# Patient Record
Sex: Female | Born: 1945 | Race: White | Hispanic: No | Marital: Single | Smoking: Never smoker
Health system: Southern US, Community
[De-identification: ages and names within clinical notes are randomized; demographics above are authoritative.]

## PROBLEM LIST (undated history)

## (undated) DIAGNOSIS — I10 Essential (primary) hypertension: Secondary | ICD-10-CM

## (undated) DIAGNOSIS — S329XXA Fracture of unspecified parts of lumbosacral spine and pelvis, initial encounter for closed fracture: Secondary | ICD-10-CM

## (undated) DIAGNOSIS — S72001A Fracture of unspecified part of neck of right femur, initial encounter for closed fracture: Secondary | ICD-10-CM

## (undated) DIAGNOSIS — D649 Anemia, unspecified: Secondary | ICD-10-CM

## (undated) DIAGNOSIS — E119 Type 2 diabetes mellitus without complications: Secondary | ICD-10-CM

## (undated) DIAGNOSIS — J449 Chronic obstructive pulmonary disease, unspecified: Secondary | ICD-10-CM

## (undated) DIAGNOSIS — S72002A Fracture of unspecified part of neck of left femur, initial encounter for closed fracture: Secondary | ICD-10-CM

## (undated) HISTORY — PX: CARPAL TUNNEL RELEASE: SHX101

## (undated) HISTORY — PX: FOOT SURGERY: SHX648

## (undated) HISTORY — PX: KNEE SURGERY: SHX244

## (undated) HISTORY — PX: OTHER SURGICAL HISTORY: SHX169

## (undated) HISTORY — PX: RIB FRACTURE SURGERY: SHX2358

---

## 2013-09-29 ENCOUNTER — Encounter (HOSPITAL_COMMUNITY): Payer: Self-pay | Admitting: Emergency Medicine

## 2013-09-29 ENCOUNTER — Inpatient Hospital Stay (HOSPITAL_COMMUNITY)
Admission: EM | Admit: 2013-09-29 | Discharge: 2013-10-02 | DRG: 602 | Disposition: A | Discharge status: Skilled Nursing Facility | Payer: Medicare Other | Attending: Internal Medicine | Admitting: Internal Medicine

## 2013-09-29 ENCOUNTER — Inpatient Hospital Stay (HOSPITAL_COMMUNITY): Payer: Medicare Other

## 2013-09-29 ENCOUNTER — Emergency Department (INDEPENDENT_AMBULATORY_CARE_PROVIDER_SITE_OTHER)
Admission: EM | Admit: 2013-09-29 | Discharge: 2013-09-29 | Disposition: A | Discharge status: Other Acute Inpatient Hospital | Payer: Medicare Other | Source: Home / Self Care | Attending: Emergency Medicine | Admitting: Emergency Medicine

## 2013-09-29 DIAGNOSIS — Z86718 Personal history of other venous thrombosis and embolism: Secondary | ICD-10-CM | POA: Diagnosis not present

## 2013-09-29 DIAGNOSIS — Z880 Allergy status to penicillin: Secondary | ICD-10-CM | POA: Diagnosis not present

## 2013-09-29 DIAGNOSIS — E1149 Type 2 diabetes mellitus with other diabetic neurological complication: Secondary | ICD-10-CM | POA: Diagnosis present

## 2013-09-29 DIAGNOSIS — E1142 Type 2 diabetes mellitus with diabetic polyneuropathy: Secondary | ICD-10-CM | POA: Diagnosis present

## 2013-09-29 DIAGNOSIS — J4489 Other specified chronic obstructive pulmonary disease: Secondary | ICD-10-CM | POA: Diagnosis present

## 2013-09-29 DIAGNOSIS — L02619 Cutaneous abscess of unspecified foot: Secondary | ICD-10-CM | POA: Diagnosis present

## 2013-09-29 DIAGNOSIS — R600 Localized edema: Secondary | ICD-10-CM | POA: Diagnosis present

## 2013-09-29 DIAGNOSIS — I509 Heart failure, unspecified: Secondary | ICD-10-CM | POA: Diagnosis present

## 2013-09-29 DIAGNOSIS — IMO0002 Reserved for concepts with insufficient information to code with codable children: Secondary | ICD-10-CM | POA: Diagnosis present

## 2013-09-29 DIAGNOSIS — E1165 Type 2 diabetes mellitus with hyperglycemia: Secondary | ICD-10-CM | POA: Diagnosis not present

## 2013-09-29 DIAGNOSIS — L8993 Pressure ulcer of unspecified site, stage 3: Secondary | ICD-10-CM | POA: Diagnosis not present

## 2013-09-29 DIAGNOSIS — E1169 Type 2 diabetes mellitus with other specified complication: Secondary | ICD-10-CM

## 2013-09-29 DIAGNOSIS — J42 Unspecified chronic bronchitis: Secondary | ICD-10-CM

## 2013-09-29 DIAGNOSIS — Z794 Long term (current) use of insulin: Secondary | ICD-10-CM

## 2013-09-29 DIAGNOSIS — E119 Type 2 diabetes mellitus without complications: Secondary | ICD-10-CM | POA: Diagnosis present

## 2013-09-29 DIAGNOSIS — L89609 Pressure ulcer of unspecified heel, unspecified stage: Secondary | ICD-10-CM | POA: Diagnosis present

## 2013-09-29 DIAGNOSIS — L97409 Non-pressure chronic ulcer of unspecified heel and midfoot with unspecified severity: Secondary | ICD-10-CM | POA: Diagnosis not present

## 2013-09-29 DIAGNOSIS — Z6829 Body mass index (BMI) 29.0-29.9, adult: Secondary | ICD-10-CM | POA: Diagnosis not present

## 2013-09-29 DIAGNOSIS — E1101 Type 2 diabetes mellitus with hyperosmolarity with coma: Secondary | ICD-10-CM

## 2013-09-29 DIAGNOSIS — I82409 Acute embolism and thrombosis of unspecified deep veins of unspecified lower extremity: Secondary | ICD-10-CM | POA: Diagnosis present

## 2013-09-29 DIAGNOSIS — J449 Chronic obstructive pulmonary disease, unspecified: Secondary | ICD-10-CM | POA: Diagnosis not present

## 2013-09-29 DIAGNOSIS — Z8781 Personal history of (healed) traumatic fracture: Secondary | ICD-10-CM

## 2013-09-29 DIAGNOSIS — L03119 Cellulitis of unspecified part of limb: Principal | ICD-10-CM

## 2013-09-29 DIAGNOSIS — E440 Moderate protein-calorie malnutrition: Secondary | ICD-10-CM | POA: Diagnosis not present

## 2013-09-29 DIAGNOSIS — L8990 Pressure ulcer of unspecified site, unspecified stage: Secondary | ICD-10-CM

## 2013-09-29 DIAGNOSIS — L089 Local infection of the skin and subcutaneous tissue, unspecified: Secondary | ICD-10-CM | POA: Diagnosis present

## 2013-09-29 DIAGNOSIS — E1100 Type 2 diabetes mellitus with hyperosmolarity without nonketotic hyperglycemic-hyperosmolar coma (NKHHC): Secondary | ICD-10-CM

## 2013-09-29 DIAGNOSIS — L97429 Non-pressure chronic ulcer of left heel and midfoot with unspecified severity: Secondary | ICD-10-CM

## 2013-09-29 DIAGNOSIS — I82403 Acute embolism and thrombosis of unspecified deep veins of lower extremity, bilateral: Secondary | ICD-10-CM

## 2013-09-29 DIAGNOSIS — Z881 Allergy status to other antibiotic agents status: Secondary | ICD-10-CM

## 2013-09-29 DIAGNOSIS — I10 Essential (primary) hypertension: Secondary | ICD-10-CM | POA: Diagnosis not present

## 2013-09-29 DIAGNOSIS — R609 Edema, unspecified: Secondary | ICD-10-CM

## 2013-09-29 DIAGNOSIS — L97509 Non-pressure chronic ulcer of other part of unspecified foot with unspecified severity: Secondary | ICD-10-CM | POA: Diagnosis present

## 2013-09-29 DIAGNOSIS — I5032 Chronic diastolic (congestive) heart failure: Secondary | ICD-10-CM | POA: Diagnosis present

## 2013-09-29 HISTORY — DX: Fracture of unspecified part of neck of left femur, initial encounter for closed fracture: S72.002A

## 2013-09-29 HISTORY — DX: Chronic obstructive pulmonary disease, unspecified: J44.9

## 2013-09-29 HISTORY — DX: Type 2 diabetes mellitus without complications: E11.9

## 2013-09-29 HISTORY — DX: Fracture of unspecified part of neck of right femur, initial encounter for closed fracture: S72.001A

## 2013-09-29 HISTORY — DX: Fracture of unspecified parts of lumbosacral spine and pelvis, initial encounter for closed fracture: S32.9XXA

## 2013-09-29 HISTORY — DX: Anemia, unspecified: D64.9

## 2013-09-29 HISTORY — DX: Essential (primary) hypertension: I10

## 2013-09-29 LAB — CBC
HCT: 34.9 % — ABNORMAL LOW (ref 36.0–46.0)
HEMOGLOBIN: 12.5 g/dL (ref 12.0–15.0)
MCH: 28.1 pg (ref 26.0–34.0)
MCHC: 35.8 g/dL (ref 30.0–36.0)
MCV: 78.4 fL (ref 78.0–100.0)
Platelets: 156 10*3/uL (ref 150–400)
RBC: 4.45 MIL/uL (ref 3.87–5.11)
RDW: 13 % (ref 11.5–15.5)
WBC: 5.8 10*3/uL (ref 4.0–10.5)

## 2013-09-29 LAB — BASIC METABOLIC PANEL
Anion gap: 17 — ABNORMAL HIGH (ref 5–15)
BUN: 22 mg/dL (ref 6–23)
CALCIUM: 9.5 mg/dL (ref 8.4–10.5)
CO2: 20 mEq/L (ref 19–32)
Chloride: 99 mEq/L (ref 96–112)
Creatinine, Ser: 1.02 mg/dL (ref 0.50–1.10)
GFR calc Af Amer: 64 mL/min — ABNORMAL LOW (ref >90)
GFR calc non Af Amer: 56 mL/min — ABNORMAL LOW (ref >90)
GLUCOSE: 365 mg/dL — AB (ref 70–99)
Potassium: 4.4 mEq/L (ref 3.7–5.3)
Sodium: 136 mEq/L — ABNORMAL LOW (ref 137–147)

## 2013-09-29 LAB — GLUCOSE, CAPILLARY: Glucose-Capillary: 310 mg/dL — ABNORMAL HIGH (ref 70–99)

## 2013-09-29 LAB — CBG MONITORING, ED: Glucose-Capillary: 353 mg/dL — ABNORMAL HIGH (ref 70–99)

## 2013-09-29 MED ORDER — DIPHENHYDRAMINE HCL 50 MG/ML IJ SOLN
25.0000 mg | Freq: Once | INTRAMUSCULAR | Status: AC
  Administered 2013-09-29: 25 mg via INTRAVENOUS
  Filled 2013-09-29: qty 1

## 2013-09-29 MED ORDER — SODIUM CHLORIDE 0.9 % IV SOLN
Freq: Once | INTRAVENOUS | Status: AC
  Administered 2013-09-29: 21:00:00 via INTRAVENOUS

## 2013-09-29 MED ORDER — OXYCODONE-ACETAMINOPHEN 5-325 MG PO TABS
1.0000 | ORAL_TABLET | Freq: Once | ORAL | Status: AC
Start: 2013-09-29 — End: 2013-09-29
  Administered 2013-09-29: 1 via ORAL
  Filled 2013-09-29: qty 1

## 2013-09-29 MED ORDER — VANCOMYCIN HCL IN DEXTROSE 1-5 GM/200ML-% IV SOLN
1000.0000 mg | Freq: Once | INTRAVENOUS | Status: AC
  Administered 2013-09-29: 1000 mg via INTRAVENOUS
  Filled 2013-09-29: qty 200

## 2013-09-29 NOTE — H&P (Signed)
[facility] History and Physical  Sylvan Lahm ZOX:[id number] DOB: [birth year] DOA: 09/29/2013  Referring physician: ED physician PCP: No PCP Per Patient  Specialists:   Chief Complaint: Foot ulcers.  HPI: Katie Mullins is a 68 y.o. 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  Patient reports that she has several ulcers over her feet and leg. The biggest ulcer is located in his left heel. It has been going on since March 2015. She took oral antibiotics when she was in nursing home (does not remember the name of antibiotics) without significant help. It has been progressively getting worse. It becomes more tender recently.   She also has a small ulcers over left great toe, which is also tender, but no drainage. The third ulcers is located over her right medial lower leg. It is tender, but no drainage coming out. She does not have fever or chills, cough, chest pain, nausea, or vomiting or abdominal pain. Patient also reports bilateral lower leg edema without clear diagnosis. She is currently taking Lasix for leg edema. She does not have leukocytosis on admission. She is slightly tachycardia and creatinine normal on admission. She is admitted to MedSurg bed for further evaluation and treatment.   Of note, her social situation is very concerning. She was just discharged from a nursing home in [place] to [facility] [place] on 9/112/15. No clear plan regarding where to live next. She has 4 sons, one of them is living in [place].   Review of Systems: As presented in the history of presenting illness, rest negative.  Where does patient live?  [facility]. Can patient participate in ADLs? Partially.   Allergy:  Allergies  Allergen Reactions  . Cephalexin   . Iodine   . Levaquin [Levofloxacin In D5w]    . Penicillins     anaphylaxis    Past Medical History  Diagnosis Date  . Hypertension   . Diabetes mellitus without complication   . COPD (chronic obstructive pulmonary disease)   . Anemia   . Hip fracture, right   . Hip fracture, left   . Pelvic fracture     Past Surgical History  Procedure Laterality Date  . Knee surgery      bilateral  . Carpal tunnel release      Social History:  reports that she has never smoked. She does not have any smokeless tobacco history on file. She reports that she does not drink alcohol. Her drug history is not on file.  Family History:  Family History  Problem Relation Age of Onset  . Rheum arthritis Mother   . Cancer Mother     died of stomach cancer  . Cancer Father     died of prostate cancer  . Cancer Brother     Prostate cancer  . Diabetes Brother      Prior to Admission medications   Medication Sig Start Date End Date Taking? Authorizing Provider  cyclobenzaprine (FLEXERIL) 5 MG tablet Take 5 mg by mouth 3 (three) times daily as needed for muscle spasms.   Yes Historical Provider, MD  furosemide (LASIX) 40 MG tablet Take 40 mg by mouth daily.    Yes Historical Provider, MD  insulin glargine (LANTUS) 100 UNIT/ML injection Inject 15 Units into the skin at bedtime.    Yes Historical Provider, MD  insulin lispro (  HUMALOG) 100 UNIT/ML injection Inject 5 Units into the skin 3 (three) times daily before meals.    Yes Historical Provider, MD  lisinopril (PRINIVIL,ZESTRIL) 10 MG tablet Take 10 mg by mouth daily.   Yes Historical Provider, MD  omeprazole (PRILOSEC) 20 MG capsule Take 20 mg by mouth daily.   Yes Historical Provider, MD  oxyCODONE-acetaminophen (PERCOCET/ROXICET) 5-325 MG per tablet Take 1 tablet by mouth every 4 (four) hours as needed for moderate pain or severe pain.   Yes Historical Provider, MD  potassium chloride (K-DUR,KLOR-CON) 10 MEQ tablet Take 10 mEq by mouth 2 (two) times daily.   Yes Historical Provider, MD   Rivaroxaban (XARELTO) 15 MG TABS tablet Take 15 mg by mouth daily.   Yes Historical Provider, MD    Physical Exam: Filed Vitals:   09/29/13 2200 09/29/13 2230 09/29/13 2315 09/30/13 0000  BP: 150/73 161/81 144/68 145/87  Pulse: 98 99 99 94  Temp:      TempSrc:      Resp: SpO2: 97% 99% 98% 96%   General: Not in acute distress HEENT:       Eyes: PERRL, EOMI, no scleral icterus       ENT: No discharge from the ears and nose, no pharynx injection, no tonsillar enlargement.        Neck: No JVD, no bruit, no mass felt. Cardiac: S1/S2, RRR, No murmurs, gallops or rubs Pulm: Good air movement bilaterally. Clear to auscultation bilaterally. No rales, wheezing, rhonchi or rubs. Abd: Soft, nondistended, nontender, no rebound pain, no organomegaly, BS present Ext: trace amount of leg edema bilaterally. There is an ulcer on her left heel which has yellow colored draining. It is very tender and erythematous, proximately 1.5x3 cm in size. She has a small ulcerated area over right medial lower leg, proximately 0.3 x 2 cm in size, with some surrounding erythema. It is tender to palpation, but no purulent drainage. She also has a very tiny purplish area on the tip of her left great toe. It is tender, but no drainage. Her feet are pale and cool. Capillary refill is poor, but DP/PT pulse are palpable.  Musculoskeletal: No joint deformities, erythema, or stiffness, ROM full Skin: No rashes.  Neuro: Alert and oriented X3, cranial nerves II-XII grossly intact, muscle strength 5/5 in all extremeties, sensation to light touch intact. Brachial reflex 2+ bilaterally. Knee reflex 1+ bilaterally.  Psych: Patient is not psychotic, no suicidal or hemocidal ideation.  Labs on Admission:  Basic Metabolic Panel:  Recent Labs Lab 09/29/13 2029  NA 136*  K 4.4  CL 99  CO2 20  GLUCOSE 365*  BUN 22  CREATININE 1.02  CALCIUM 9.5   Liver Function Tests: No results found for this basename: AST, ALT,  ALKPHOS, BILITOT, PROT, ALBUMIN,  in the last 168 hours No results found for this basename: LIPASE, AMYLASE,  in the last 168 hours No results found for this basename: AMMONIA,  in the last 168 hours CBC:  Recent Labs Lab 09/29/13 2029  WBC 5.8  HGB 12.5  HCT 34.9*  MCV 78.4  PLT 156   Cardiac Enzymes: No results found for this basename: CKTOTAL, CKMB, CKMBINDEX, TROPONINI,  in the last 168 hours  BNP (last 3 results) No results found for this basename: PROBNP,  in the last 8760 hours CBG:  Recent Labs Lab 09/29/13 1524 09/29/13 1921  GLUCAP 310* 353*    Radiological Exams on Admission: Dg Foot Complete Left  09/29/2013   CLINICAL DATA:  Sore on posterior heel with no injury, infected ulcer, diabetes  EXAM: LEFT FOOT - COMPLETE 3+ VIEW  COMPARISON:  None.  FINDINGS: Diffuse osteopenia. No evidence of periosteal reaction. No evidence of cortical destruction. Extensive vascular calcification. No fracture or dislocation.  IMPRESSION: No acute findings.  No radiographic evidence of osteomyelitis.   Electronically Signed   By: Esperanza Heir M.D.   On: 09/29/2013 23:24    EKG: Independently reviewed.   Assessment/Plan Principal Problem:   Left foot infection Active Problems:   DVT (deep venous thrombosis)   Hypertension   Diabetes mellitus without complication   COPD (chronic obstructive pulmonary disease)   Bilateral leg edema  1. foot infection: The ulcer over her left heel is concerning for infection given redness, tenderness and yellow-colored discharge. She seems to have poor circulation in legs. Given hx of diabetes, it is appropriate to treat empirically with antibiotics to cover mixed flora. At the same, will get X-ray to evaluate any possibility of osteomyelitis. Currently patient is hemodynamically stable, not in septic picture.  - will admit to MedSurg bed - IV vancomycin plus oral Flagyl - Blood culture x2 - X-ray left foot - Hold Lasix given that patient is  at risk for sepsis - Discontinue IV fluid, given that patient has a bilateral leg edema without clear diagnosis. It is possible that patient has a chronic congestive heart failure. - Consult wound care  2. recurrent lower leg DVT: Currently on Xarelto. No signs of bleeding. - continue Xarelto  3. bilateral leg edema: this seems to be a chronic issue without clear diagnosis. Patient is currently taking Lasix for leg edema. She has trace amount of leg edema on admission. It is possible that patient could have undiagnosed congestive heart failure given her loang hx of HTN - hold lasix for now as above. - check 2d echo  4. DM-II: Patient is taking Lantus and sliding scale insulin at home. She reports taking Lantus 15 units twice a day, but our record showing Lantus 15 units daily. No A1c was recorded.  - will start with lantus 15 U daily for now and make an adjustment tomorrow based on her CBG level - SSI - check A1c  DVT ppx: on Xarelto  Code Status: Full code Family Communication: None at bed side. Disposition Plan: Admit to inpatient  Lorretta Harp [facility] Pager [phone]  If 7PM-7AM, please contact night-coverage [email or web address] Password [facility] 09/30/2013, 12:33 AM

## 2013-09-29 NOTE — Discharge Instructions (Signed)
We have determined that your problem requires further evaluation in the emergency department.  We will take care of your transport there.  Once at the emergency department, you will be evaluated by a provider and they will order whatever treatment or tests they deem necessary.  We cannot guarantee that they will do any specific test or do any specific treatment.  ° °

## 2013-09-29 NOTE — ED Notes (Signed)
Pt attempting to get out of bed to get pain medication out of her bag.  Pt states she needs Flexeril and Percocet because her "body is locking up."  Informed pt that MD and made her NPO but that I would check with him to see about pain medication.  Encouraged pt not to take her own medication without the MDs permission and to call for assistance when she needs to get up.  Bed rails up and call bell within reach.

## 2013-09-29 NOTE — ED Notes (Signed)
Pt still unable to give urine sample

## 2013-09-29 NOTE — ED Notes (Addendum)
Pt states that she has ulcers to left foot and toe and rt lower leg. Pt states that the left side has been present since march and are not healing. Drainage noted to left heal. Foot wrapped.

## 2013-09-29 NOTE — ED Provider Notes (Signed)
CSN: [id number]     Arrival date & time 09/29/13  1611 History   First MD Initiated Contact with Patient 09/29/13 1939     Chief Complaint  Patient presents with  . Leg Pain  . Foot Pain     (Consider location/radiation/quality/duration/timing/severity/associated sxs/prior Treatment) Patient is a 68 y.o. female presenting with leg pain and lower extremity pain. The history is provided by the patient.  Leg Pain Location:  Foot Injury: no   Foot location:  Sole of L foot Pain details:    Quality:  Aching   Severity:  Moderate   Onset quality:  Gradual   Duration:  10 days   Timing:  Constant   Progression:  Unchanged Chronicity:  New Relieved by:  Nothing Worsened by:  Nothing tried Associated symptoms: no decreased ROM, no fatigue, no fever, no neck pain and no numbness   Foot Pain    Past Medical History  Diagnosis Date  . Hypertension   . Diabetes mellitus without complication   . COPD (chronic obstructive pulmonary disease)   . Anemia   . Hip fracture, right   . Hip fracture, left   . Pelvic fracture    Past Surgical History  Procedure Laterality Date  . Knee surgery      bilateral  . Carpal tunnel release     Family History  Problem Relation Age of Onset  . Rheum arthritis Mother   . Cancer Mother     died of stomach cancer  . Cancer Father     died of prostate cancer  . Cancer Brother     Prostate cancer  . Diabetes Brother    History  Substance Use Topics  . Smoking status: Never Smoker   . Smokeless tobacco: Not on file  . Alcohol Use: No   OB History   Grav Para Term Preterm Abortions TAB SAB Ect Mult Living                 Review of Systems  Constitutional: Negative for fever and fatigue.  Musculoskeletal: Negative for neck pain.  All other systems reviewed and are negative.     Allergies  Cephalexin; Iodine; Levaquin; and Penicillins  Home Medications   Prior to Admission medications   Medication Sig Start Date End Date  Taking? Authorizing Provider  cyclobenzaprine (FLEXERIL) 5 MG tablet Take 5 mg by mouth 3 (three) times daily as needed for muscle spasms.   Yes Historical Provider, MD  furosemide (LASIX) 40 MG tablet Take 40 mg by mouth daily.    Yes Historical Provider, MD  insulin glargine (LANTUS) 100 UNIT/ML injection Inject 15 Units into the skin at bedtime.    Yes Historical Provider, MD  insulin lispro (HUMALOG) 100 UNIT/ML injection Inject 5 Units into the skin 3 (three) times daily before meals.    Yes Historical Provider, MD  lisinopril (PRINIVIL,ZESTRIL) 10 MG tablet Take 10 mg by mouth daily.   Yes Historical Provider, MD  omeprazole (PRILOSEC) 20 MG capsule Take 20 mg by mouth daily.   Yes Historical Provider, MD  oxyCODONE-acetaminophen (PERCOCET/ROXICET) 5-325 MG per tablet Take 1 tablet by mouth every 4 (four) hours as needed for moderate pain or severe pain.   Yes Historical Provider, MD  potassium chloride (K-DUR,KLOR-CON) 10 MEQ tablet Take 10 mEq by mouth 2 (two) times daily.   Yes Historical Provider, MD  Rivaroxaban (XARELTO) 15 MG TABS tablet Take 15 mg by mouth daily.   Yes Historical Provider, MD  BP 144/68  Pulse 99  Temp(Src) 97.6 F (36.4 C) (Oral)  Resp 17  SpO2 98% Physical Exam  Nursing note and vitals reviewed. Constitutional: She is oriented to person, place, and time. She appears well-developed and well-nourished. No distress.  HENT:  Head: Normocephalic and atraumatic.  Mouth/Throat: Oropharynx is clear and moist.  Eyes: EOM are normal. Pupils are equal, round, and reactive to light.  Neck: Normal range of motion. Neck supple.  Cardiovascular: Normal rate and regular rhythm.  Exam reveals no friction rub.   No murmur heard. Pulmonary/Chest: Effort normal and breath sounds normal. No respiratory distress. She has no wheezes. She has no rales.  Abdominal: Soft. She exhibits no distension. There is no tenderness. There is no rebound.  Musculoskeletal: Normal range of  motion. She exhibits no edema.       Feet:  Neurological: She is alert and oriented to person, place, and time. No cranial nerve deficit. She exhibits normal muscle tone. Coordination normal.  Skin: No rash noted. She is not diaphoretic.    ED Course  Procedures (including critical care time) Labs Review Labs Reviewed  CBC - Abnormal; Notable for the following:    HCT 34.9 (*)    All other components within normal limits  BASIC METABOLIC PANEL - Abnormal; Notable for the following:    Sodium 136 (*)    Glucose, Bld 365 (*)    GFR calc non Af Amer 56 (*)    GFR calc Af Amer 64 (*)    Anion gap 17 (*)    All other components within normal limits  CBG MONITORING, ED - Abnormal; Notable for the following:    Glucose-Capillary 353 (*)    All other components within normal limits  URINALYSIS, ROUTINE W REFLEX MICROSCOPIC    Imaging Review Dg Foot Complete Left  09/29/2013   CLINICAL DATA:  Sore on posterior heel with no injury, infected ulcer, diabetes  EXAM: LEFT FOOT - COMPLETE 3+ VIEW  COMPARISON:  None.  FINDINGS: Diffuse osteopenia. No evidence of periosteal reaction. No evidence of cortical destruction. Extensive vascular calcification. No fracture or dislocation.  IMPRESSION: No acute findings.  No radiographic evidence of osteomyelitis.   Electronically Signed   By: Esperanza Heir M.D.   On: 09/29/2013 23:24     EKG Interpretation None      MDM   Final diagnoses:  Diabetes mellitus without complication    72F sent from Urgent Care for concerns of being unable to care for herself. Recently discharged from a nursing home s/p hip/knee surgery. Has been staying at an extended stay hotel and doesn't have anyone to help take care of her. She has developed ulcers on her feet from being bedbound.  Here has L heel wound with purulent base, no surrounding cellulitis or red streaking. Patient reports diarrhea, chills, nausea - concern for possible systemic symptoms from her heel  infection.  Admitted.    Elwin Mocha, MD 09/29/13 [phone]

## 2013-09-29 NOTE — ED Provider Notes (Signed)
Chief Complaint   Foot Pain   History of Present Illness   Katie Mullins is an unfortunate 68 year old female who was in a nursing home for the past year due to bilateral hip and pelvic fractures. She was released from the nursing home 12 days ago and moved here to [place], even though she has no family in [place] to look after her or take care of her. She is living on her own in an Extended Stay [address] and has no primary caretaker in the area. She uses a wheelchair and walker to get around, although I get the impression that she's not very mobile or very ambulatory. Her main reason for presenting here today was a painful ulcer on her right pretibial surface. This is been going on for the past 6 years, but opened up again 10 days ago and has been sleeping a clear fluid. She has a chronic ulcer on her left heel going on since March. This is about the same and is been draining a little bit of clear liquid. She also has a small ulcerated spot on the tip of her left big toe. She has pain in both of her legs and aching. She is short of breath with minimal exertion she denies any fever or chills. She's had no chest pain, abdominal pain, nausea, or vomiting. She's got diabetes and notes her blood sugars are out of control, often in the 400s. She is on Lantus and Humalog.  Review of Systems     Other than as noted above, the patient denies any of the following symptoms: Systemic:  No fever, chills, fatigue, myalgias, headache, or anorexia. Eye:  No redness, pain or drainage. ENT:  No earache, nasal congestion, rhinorrhea, sinus pressure, or sore throat. Lungs:  No cough, sputum production, wheezing, shortness of breath.  Cardiovascular:  No chest pain, palpitations, or syncope. GI:  No nausea, vomiting, abdominal pain or diarrhea. GU:  No dysuria, frequency, or hematuria. Skin:  No rash or pruritis.   PMFSH     Past medical history, family history, social history, meds, and allergies were  reviewed.  She's allergic to penicillin and IV contrast dye, Levaquin, and cephalexin. She has high blood pressure, and is on a long list of medications.  Physical Examination    Vital signs:  BP 139/82  Pulse 102  Temp(Src) 98.7 F (37.1 C) (Oral)  Resp 16  SpO2 98% General:  Alert, in no distress. Eye:  PERRL, full EOMs.  Lids and conjunctivas were normal. ENT:  TMs and canals were normal, without erythema or inflammation.  Nasal mucosa was clear and uncongested, without drainage.  Mucous membranes were moist.  Pharynx was clear, without exudate or drainage.  There were no oral ulcerations or lesions. Neck:  Supple, no adenopathy, tenderness or mass. Thyroid was normal. Lungs:  No respiratory distress.  Lungs were clear to auscultation, without wheezes, rales or rhonchi.  Breath sounds were clear and equal bilaterally. Heart:  Regular rhythm, without gallops, murmers or rubs. Abdomen:  Soft, flat, and non-tender to palpation.  No hepatosplenomagaly or mass. Extremities: She has a 2 cm ulcerated area on her left pretibial surface with some surrounding erythema and this was tender to palpation. There is no purulent drainage. There is a large decubitus ulcer on her left heel which was not draining any purulent drainage. She also has a small purplish area on the tip of her left great toe. She states this just appeared recently. Her feet are pale and  cool. Capillary refill is poor. She has a good, bounding pulse on the left side and on the right. Skin:  Clear, warm, and dry, without rash or lesions.        Mood and   Labs   Results for orders placed during the hospital encounter of 09/29/13  GLUCOSE, CAPILLARY      Result Value Ref Range   Glucose-Capillary 310 (*) 70 - 99 mg/dL   Comment 1 Notify RN     Comment 2 Documented in Chart     Assessment   The primary encounter diagnosis was Decubitus ulcers. A diagnosis of Type 2 diabetes mellitus with hyperosmolarity without coma was  also pertinent to this visit.  My concern is that this lady has some significant looking ulcerations on her legs, is essentially living by herself without any caregiver, and on her own as for his medical care. I feel she needs to be back to nursing home again. She'll need a three-day hospital stay prior to being able to be placed in a nursing home. Also her diabetes is not well controlled.  Plan     The patient was transferred to the ED via [facility] in stable condition.  Medical Decision Making:  68 year old female who needs to be admitted for nursing home placement.  She was just discharged from a nursing home in [place], [place] 12 days ago after being a patient there for 1  Year.  She has been staying at [facility] here for past 12 days with no one to take care of her.  She has developed several decubitus  Ulcers on her legs.  Her blood sugar is in the 400s.  She needs to return a nursing home, but will need a 3 day hospitalization first.          Reuben Likes, MD 09/29/13 [phone]

## 2013-09-29 NOTE — ED Notes (Signed)
Pt c/o head itching.  Vancomycin just completed. Dr. Gwendolyn Grant notified of pt itching and pain.

## 2013-09-29 NOTE — ED Notes (Signed)
Multiple problems. Relocating to area

## 2013-09-29 NOTE — ED Notes (Addendum)
Patient transported to X-ray 

## 2013-09-29 NOTE — ED Notes (Signed)
Admitting MD at bedside.

## 2013-09-29 NOTE — ED Notes (Signed)
Pt monitored by pulse ox, bp cuff, and 5-lead. 

## 2013-09-30 ENCOUNTER — Encounter (HOSPITAL_COMMUNITY): Payer: Self-pay | Admitting: *Deleted

## 2013-09-30 ENCOUNTER — Inpatient Hospital Stay (HOSPITAL_COMMUNITY): Payer: Medicare Other

## 2013-09-30 DIAGNOSIS — I82409 Acute embolism and thrombosis of unspecified deep veins of unspecified lower extremity: Secondary | ICD-10-CM

## 2013-09-30 DIAGNOSIS — E440 Moderate protein-calorie malnutrition: Secondary | ICD-10-CM | POA: Insufficient documentation

## 2013-09-30 DIAGNOSIS — I059 Rheumatic mitral valve disease, unspecified: Secondary | ICD-10-CM

## 2013-09-30 DIAGNOSIS — I10 Essential (primary) hypertension: Secondary | ICD-10-CM

## 2013-09-30 DIAGNOSIS — J42 Unspecified chronic bronchitis: Secondary | ICD-10-CM

## 2013-09-30 DIAGNOSIS — L089 Local infection of the skin and subcutaneous tissue, unspecified: Secondary | ICD-10-CM

## 2013-09-30 DIAGNOSIS — E119 Type 2 diabetes mellitus without complications: Secondary | ICD-10-CM

## 2013-09-30 DIAGNOSIS — L97409 Non-pressure chronic ulcer of unspecified heel and midfoot with unspecified severity: Secondary | ICD-10-CM

## 2013-09-30 LAB — URINALYSIS, ROUTINE W REFLEX MICROSCOPIC
Bilirubin Urine: NEGATIVE
KETONES UR: NEGATIVE mg/dL
Nitrite: NEGATIVE
PH: 5 (ref 5.0–8.0)
Protein, ur: 30 mg/dL — AB
Specific Gravity, Urine: 1.015 (ref 1.005–1.030)
Urobilinogen, UA: 0.2 mg/dL (ref 0.0–1.0)

## 2013-09-30 LAB — COMPREHENSIVE METABOLIC PANEL
ALBUMIN: 3.3 g/dL — AB (ref 3.5–5.2)
ALK PHOS: 128 U/L — AB (ref 39–117)
ALT: 16 U/L (ref 0–35)
ANION GAP: 13 (ref 5–15)
AST: 16 U/L (ref 0–37)
BUN: 23 mg/dL (ref 6–23)
CO2: 24 mEq/L (ref 19–32)
Calcium: 8.7 mg/dL (ref 8.4–10.5)
Chloride: 99 mEq/L (ref 96–112)
Creatinine, Ser: 1.19 mg/dL — ABNORMAL HIGH (ref 0.50–1.10)
GFR calc Af Amer: 54 mL/min — ABNORMAL LOW (ref >90)
GFR calc non Af Amer: 46 mL/min — ABNORMAL LOW (ref >90)
Glucose, Bld: 411 mg/dL — ABNORMAL HIGH (ref 70–99)
Potassium: 3.7 mEq/L (ref 3.7–5.3)
Sodium: 136 mEq/L — ABNORMAL LOW (ref 137–147)
TOTAL PROTEIN: 5.9 g/dL — AB (ref 6.0–8.3)
Total Bilirubin: 0.5 mg/dL (ref 0.3–1.2)

## 2013-09-30 LAB — CBC WITH DIFFERENTIAL/PLATELET
Basophils Absolute: 0 10*3/uL (ref 0.0–0.1)
Basophils Relative: 0 % (ref 0–1)
Eosinophils Absolute: 0.1 10*3/uL (ref 0.0–0.7)
Eosinophils Relative: 2 % (ref 0–5)
HCT: 30 % — ABNORMAL LOW (ref 36.0–46.0)
HEMOGLOBIN: 10.4 g/dL — AB (ref 12.0–15.0)
LYMPHS PCT: 31 % (ref 12–46)
Lymphs Abs: 1.6 10*3/uL (ref 0.7–4.0)
MCH: 28 pg (ref 26.0–34.0)
MCHC: 34.7 g/dL (ref 30.0–36.0)
MCV: 80.6 fL (ref 78.0–100.0)
MONO ABS: 0.4 10*3/uL (ref 0.1–1.0)
Monocytes Relative: 9 % (ref 3–12)
NEUTROS ABS: 3 10*3/uL (ref 1.7–7.7)
Neutrophils Relative %: 59 % (ref 43–77)
Platelets: 126 10*3/uL — ABNORMAL LOW (ref 150–400)
RBC: 3.72 MIL/uL — ABNORMAL LOW (ref 3.87–5.11)
RDW: 13 % (ref 11.5–15.5)
WBC: 5 10*3/uL (ref 4.0–10.5)

## 2013-09-30 LAB — URINE MICROSCOPIC-ADD ON

## 2013-09-30 LAB — PROTIME-INR
INR: 1.82 — ABNORMAL HIGH (ref 0.00–1.49)
Prothrombin Time: 21.1 seconds — ABNORMAL HIGH (ref 11.6–15.2)

## 2013-09-30 LAB — HEMOGLOBIN A1C
HEMOGLOBIN A1C: 9.3 % — AB (ref <5.7)
Mean Plasma Glucose: 220 mg/dL — ABNORMAL HIGH (ref <117)

## 2013-09-30 LAB — GLUCOSE, CAPILLARY
GLUCOSE-CAPILLARY: 319 mg/dL — AB (ref 70–99)
Glucose-Capillary: 219 mg/dL — ABNORMAL HIGH (ref 70–99)
Glucose-Capillary: 232 mg/dL — ABNORMAL HIGH (ref 70–99)
Glucose-Capillary: 334 mg/dL — ABNORMAL HIGH (ref 70–99)

## 2013-09-30 LAB — CBG MONITORING, ED: GLUCOSE-CAPILLARY: 389 mg/dL — AB (ref 70–99)

## 2013-09-30 LAB — MRSA PCR SCREENING: MRSA by PCR: POSITIVE — AB

## 2013-09-30 MED ORDER — MUPIROCIN 2 % EX OINT
1.0000 "application " | TOPICAL_OINTMENT | Freq: Two times a day (BID) | CUTANEOUS | Status: DC
  Administered 2013-09-30 – 2013-10-02 (×5): 1 via NASAL
  Filled 2013-09-30 (×2): qty 22

## 2013-09-30 MED ORDER — RIVAROXABAN 15 MG PO TABS
15.0000 mg | ORAL_TABLET | Freq: Every day | ORAL | Status: DC
  Filled 2013-09-30 (×2): qty 1

## 2013-09-30 MED ORDER — INFLUENZA VAC SPLIT QUAD 0.5 ML IM SUSY
0.5000 mL | PREFILLED_SYRINGE | INTRAMUSCULAR | Status: AC
  Administered 2013-10-01: 0.5 mL via INTRAMUSCULAR
  Filled 2013-09-30: qty 0.5

## 2013-09-30 MED ORDER — CHLORHEXIDINE GLUCONATE CLOTH 2 % EX PADS
6.0000 | MEDICATED_PAD | Freq: Every day | CUTANEOUS | Status: DC
  Administered 2013-09-30 – 2013-10-02 (×3): 6 via TOPICAL

## 2013-09-30 MED ORDER — METRONIDAZOLE 500 MG PO TABS
500.0000 mg | ORAL_TABLET | Freq: Three times a day (TID) | ORAL | Status: DC
  Administered 2013-09-30 – 2013-10-02 (×9): 500 mg via ORAL
  Filled 2013-09-30 (×11): qty 1

## 2013-09-30 MED ORDER — PANTOPRAZOLE SODIUM 40 MG PO TBEC
40.0000 mg | DELAYED_RELEASE_TABLET | Freq: Every day | ORAL | Status: DC
  Administered 2013-09-30 – 2013-10-02 (×3): 40 mg via ORAL
  Filled 2013-09-30 (×2): qty 1

## 2013-09-30 MED ORDER — VANCOMYCIN HCL IN DEXTROSE 1-5 GM/200ML-% IV SOLN
1000.0000 mg | Freq: Two times a day (BID) | INTRAVENOUS | Status: DC
  Administered 2013-09-30 – 2013-10-02 (×5): 1000 mg via INTRAVENOUS
  Filled 2013-09-30 (×6): qty 200

## 2013-09-30 MED ORDER — RIVAROXABAN 20 MG PO TABS
20.0000 mg | ORAL_TABLET | Freq: Every day | ORAL | Status: DC
  Administered 2013-09-30 – 2013-10-01 (×2): 20 mg via ORAL
  Filled 2013-09-30 (×3): qty 1

## 2013-09-30 MED ORDER — INSULIN GLARGINE 100 UNIT/ML ~~LOC~~ SOLN
15.0000 [IU] | Freq: Every day | SUBCUTANEOUS | Status: DC
  Administered 2013-09-30: 15 [IU] via SUBCUTANEOUS
  Filled 2013-09-30 (×2): qty 0.15

## 2013-09-30 MED ORDER — CYCLOBENZAPRINE HCL 10 MG PO TABS
5.0000 mg | ORAL_TABLET | Freq: Three times a day (TID) | ORAL | Status: DC | PRN
  Administered 2013-09-30 – 2013-10-02 (×5): 5 mg via ORAL
  Filled 2013-09-30 (×5): qty 1

## 2013-09-30 MED ORDER — OXYCODONE-ACETAMINOPHEN 5-325 MG PO TABS: 1.0000 | ORAL_TABLET | Freq: Three times a day (TID) | ORAL | Status: DC | PRN

## 2013-09-30 MED ORDER — SODIUM CHLORIDE 0.9 % IV SOLN: 250.0000 mL | INTRAVENOUS | Status: DC | PRN

## 2013-09-30 MED ORDER — SODIUM CHLORIDE 0.9 % IJ SOLN: 3.0000 mL | INTRAMUSCULAR | Status: DC | PRN

## 2013-09-30 MED ORDER — GLUCERNA SHAKE PO LIQD
237.0000 mL | Freq: Three times a day (TID) | ORAL | Status: DC
  Administered 2013-09-30 – 2013-10-02 (×6): 237 mL via ORAL

## 2013-09-30 MED ORDER — HEPARIN SODIUM (PORCINE) 5000 UNIT/ML IJ SOLN
5000.0000 [IU] | Freq: Three times a day (TID) | INTRAMUSCULAR | Status: DC
  Administered 2013-09-30: 5000 [IU] via SUBCUTANEOUS
  Filled 2013-09-30: qty 1

## 2013-09-30 MED ORDER — SODIUM CHLORIDE 0.9 % IJ SOLN
3.0000 mL | Freq: Two times a day (BID) | INTRAMUSCULAR | Status: DC
  Administered 2013-09-30 – 2013-10-02 (×5): 3 mL via INTRAVENOUS

## 2013-09-30 MED ORDER — GADOBENATE DIMEGLUMINE 529 MG/ML IV SOLN
17.0000 mL | Freq: Once | INTRAVENOUS | Status: AC | PRN
  Administered 2013-09-30: 17 mL via INTRAVENOUS

## 2013-09-30 MED ORDER — INSULIN GLARGINE 100 UNIT/ML ~~LOC~~ SOLN
22.0000 [IU] | Freq: Every day | SUBCUTANEOUS | Status: DC
  Administered 2013-10-01: 22 [IU] via SUBCUTANEOUS
  Filled 2013-09-30 (×2): qty 0.22

## 2013-09-30 MED ORDER — DIPHENHYDRAMINE HCL 50 MG/ML IJ SOLN
12.5000 mg | Freq: Once | INTRAMUSCULAR | Status: AC
  Administered 2013-09-30: 12.5 mg via INTRAVENOUS
  Filled 2013-09-30: qty 1

## 2013-09-30 MED ORDER — INSULIN ASPART 100 UNIT/ML ~~LOC~~ SOLN: 6.0000 [IU] | Freq: Three times a day (TID) | SUBCUTANEOUS | Status: DC

## 2013-09-30 MED ORDER — LISINOPRIL 10 MG PO TABS
10.0000 mg | ORAL_TABLET | Freq: Every day | ORAL | Status: DC
  Administered 2013-09-30 – 2013-10-02 (×3): 10 mg via ORAL
  Filled 2013-09-30 (×3): qty 1

## 2013-09-30 MED ORDER — OXYCODONE-ACETAMINOPHEN 5-325 MG PO TABS
1.0000 | ORAL_TABLET | Freq: Four times a day (QID) | ORAL | Status: DC | PRN
  Administered 2013-09-30 – 2013-10-02 (×6): 1 via ORAL
  Filled 2013-09-30 (×6): qty 1

## 2013-09-30 MED ORDER — INSULIN ASPART 100 UNIT/ML ~~LOC~~ SOLN
0.0000 [IU] | Freq: Three times a day (TID) | SUBCUTANEOUS | Status: DC
  Administered 2013-09-30: 11 [IU] via SUBCUTANEOUS
  Administered 2013-09-30: 5 [IU] via SUBCUTANEOUS
  Administered 2013-10-01 (×3): 8 [IU] via SUBCUTANEOUS
  Administered 2013-10-02: 3 [IU] via SUBCUTANEOUS
  Administered 2013-10-02: 5 [IU] via SUBCUTANEOUS

## 2013-09-30 NOTE — ED Notes (Signed)
Pt concerned that pain medication was ordered every 8 hours instead of every 4.  Dr. Clyde Lundborg notified and orders changed to every 6 hours.

## 2013-09-30 NOTE — Progress Notes (Signed)
ANTIBIOTIC CONSULT NOTE - INITIAL  Pharmacy Consult for Vancomycin Indication: Cellulitis   Allergies  Allergen Reactions  . Cephalexin   . Iodine   . Levaquin [Levofloxacin In D5w]   . Penicillins     anaphylaxis    Patient Measurements: Height:  (170.2 cm) Weight: 188 lb 4.4 oz (85.4 kg) IBW/kg (Calculated) : 61.6 Adjusted Body Weight: n/a   Vital Signs: Temp: 99.5 F (37.5 C) (09/24 0219) Temp src: Oral (09/24 0219) BP: 163/86 mmHg (09/24 0219) Pulse Rate: 96 (09/24 0219) Intake/Output from previous day:   Intake/Output from this shift:    Labs:  Recent Labs  09/29/13 2029  WBC 5.8  HGB 12.5  PLT 156  CREATININE 1.02   Estimated Creatinine Clearance: 60.1 ml/min (by C-G formula based on Cr of 1.02). No results found for this basename: VANCOTROUGH, VANCOPEAK, VANCORANDOM, GENTTROUGH, GENTPEAK, GENTRANDOM, TOBRATROUGH, TOBRAPEAK, TOBRARND, AMIKACINPEAK, AMIKACINTROU, AMIKACIN,  in the last 72 hours   Microbiology: No results found for this or any previous visit (from the past 720 hour(s)).  Medical History: Past Medical History  Diagnosis Date  . Hypertension   . Diabetes mellitus without complication   . COPD (chronic obstructive pulmonary disease)   . Anemia   . Hip fracture, right   . Hip fracture, left   . Pelvic fracture     Medications:  Prescriptions prior to admission  Medication Sig Dispense Refill  . cyclobenzaprine (FLEXERIL) 5 MG tablet Take 5 mg by mouth 3 (three) times daily as needed for muscle spasms.      . furosemide (LASIX) 40 MG tablet Take 40 mg by mouth daily.       . insulin glargine (LANTUS) 100 UNIT/ML injection Inject 15 Units into the skin at bedtime.       . insulin lispro (HUMALOG) 100 UNIT/ML injection Inject 5 Units into the skin 3 (three) times daily before meals.       Marland Kitchen lisinopril (PRINIVIL,ZESTRIL) 10 MG tablet Take 10 mg by mouth daily.      Marland Kitchen omeprazole (PRILOSEC) 20 MG capsule Take 20 mg by mouth daily.       Marland Kitchen oxyCODONE-acetaminophen (PERCOCET/ROXICET) 5-325 MG per tablet Take 1 tablet by mouth every 4 (four) hours as needed for moderate pain or severe pain.      . potassium chloride (K-DUR,KLOR-CON) 10 MEQ tablet Take 10 mEq by mouth 2 (two) times daily.      . Rivaroxaban (XARELTO) 15 MG TABS tablet Take 15 mg by mouth daily.       Assessment: 60 YOF who presented to the ED with foot ulcers. Pharmacy consulted to start Vancomycin in patient for foot cellulitis. She has already received Vancomycin 1 gm IV x 1 dose in the ED. WBC is wnl, Tmax 99.5 F. CrCl ~ 60 mL/min. Pt will get x-ray to rule out any possibility of osteomyelitis.   Goal of Therapy:  Vancomycin trough level 10-15 mcg/ml  Plan:  1) Start patient on Vancomycin 1 gm IV Q 12 hours 2) Monitor CBC, renal fx, cultures and patient's clinical progress 3) Increase Vanc dose and trough goal if patient is ruled to have osteomyelitis 4) VT at [facility], PharmD.  Clinical Pharmacist Pager [phone]

## 2013-09-30 NOTE — Care Management Note (Signed)
    Page 1 of 1   10/01/2013     2:35:33 PM CARE MANAGEMENT NOTE 10/01/2013  Patient:  Katie Mullins, Katie Mullins   Account Number:  [id number]  Date Initiated:  09/30/2013  Documentation initiated by:  Letha Cape  Subjective/Objective Assessment:   dx foot ulcer  admit- lives in exteded stay hotel.     Action/Plan:   pt eval- rec snf   Anticipated DC Date:  10/01/2013   Anticipated DC Plan:  SKILLED NURSING FACILITY  In-house referral  Clinical Social Worker      DC Planning Services  CM consult      Choice offered to / List presented to:             Status of service:  Completed, signed off Medicare Important Message given?  YES (If response is "NO", the following Medicare IM given date fields will be blank) Date Medicare IM given:  10/01/2013 Medicare IM given by:  Letha Cape Date Additional Medicare IM given:   Additional Medicare IM given by:    Discharge Disposition:  SKILLED NURSING FACILITY  Per UR Regulation:  Reviewed for med. necessity/level of care/duration of stay  If discussed at Long Length of Stay Meetings, dates discussed:    Comments:  10/01/13 1359 Letha Cape RN, BSN [phone] NCM spoke with CSW ,he states patient has no medicare days, so he will need to speak with patient to see if patient will be willing to pay privately , if not she will need to go home with [facility] services, NCM  will set up [facility] services if needed.  Per CSW we will do LOG for patient to go to snf today.  09/30/13 1438 Letha Cape RN, BSN [phone] patient lives in extended stay hotel, patient will likely need snf at dc. CSW aware.

## 2013-09-30 NOTE — Progress Notes (Signed)
PATIENT DETAILS Name: Katie Mullins Age: 68 y.o. Sex: female Date of Birth: [birth year] Admit Date: 09/29/2013 Admitting Physician Lorretta Harp, MD PCP:No PCP Per Patient  Subjective: No major issues overnight  Assessment/Plan: Principal Problem:   Left foot infection - Cellulitis surrounding left heel ulcer, no discharge evident. - Admitted, and started on IV vancomycin/Flagyl-day 1 - Await wound care consultation, check MRI to make sure no osteomyelitis - Suspect will need SNF on discharge  Active Problems: Uncontrolled diabetes - Increase Lantus to 22 units, start scheduled pre-meal NovoLog 6 units 3 times a day, continue with SSI. Follow CBGs, will adjust accordingly  History of bilateral  DVT (deep venous thrombosis) - Continues Xarelto  Exertional dyspnea - Await 2-D echocardiogram. Has history of DVT-? Pulmonary hypertension from pulmonary embolism - No shortness of breath evident at rest. Continue to hold Lasix for now  Diabetic neuropathy - Claims to have tingling/numbness in her b/l Foot-will start Neurontin  Generalized weakness - Patient claims that for the past 1-2 years, she has had progressive weakness primarily in her lower extremities and basically gets around a few feet with the help of a walker, and then for longer distances uses a wheelchair. Will get a PT eval  Reported history of COPD - Lungs clear, as needed nebs  Hypertension - Controlled-continue with lisinopril  Disposition: Remain inpatient  DVT Prophylaxis: Xarelto  Code Status: Full code  Family Communication None at bedside  Procedures:  None  CONSULTS:  None  Time spent 40 minutes-which includes 50% of the time with face-to-face with patient/ family and coordinating care related to the above assessment and plan.    MEDICATIONS: Scheduled Meds: . Chlorhexidine Gluconate Cloth  6 each Topical Q0600  . [START ON 10/01/2013] Influenza vac split quadrivalent PF  0.5 mL  Intramuscular Tomorrow-1000  . insulin aspart  0-15 Units Subcutaneous TID WC  . insulin glargine  15 Units Subcutaneous QHS  . lisinopril  10 mg Oral Daily  . metroNIDAZOLE  500 mg Oral 3 times per day  . mupirocin ointment  1 application Nasal BID  . pantoprazole  40 mg Oral Daily  . rivaroxaban  20 mg Oral Q supper  . sodium chloride  3 mL Intravenous Q12H  . vancomycin  1,000 mg Intravenous Q12H   Continuous Infusions:  PRN Meds:.sodium chloride, cyclobenzaprine, oxyCODONE-acetaminophen, sodium chloride  Antibiotics: Anti-infectives   Start     Dose/Rate Route Frequency Ordered Stop   09/30/13 1000  vancomycin (VANCOCIN) IVPB 1000 mg/200 mL premix     1,000 mg 200 mL/hr over 60 Minutes Intravenous Every 12 hours 09/30/13 0353     09/30/13 0054  metroNIDAZOLE (FLAGYL) tablet 500 mg     500 mg Oral 3 times per day 09/30/13 0054     09/29/13 2030  vancomycin (VANCOCIN) IVPB 1000 mg/200 mL premix     1,000 mg 200 mL/hr over 60 Minutes Intravenous  Once 09/29/13 2029 09/29/13 2221       PHYSICAL EXAM: Vital signs in last 24 hours: Filed Vitals:   09/30/13 0045 09/30/13 0219 09/30/13 0620 09/30/13 1031  BP: 137/57 163/86 139/68 109/65  Pulse: 98 96 92   Temp:  99.5 F (37.5 C) 99.1 F (37.3 C)   TempSrc:  Oral Oral   Resp: Height:   (1.702 m)    Weight:  85.4 kg (188 lb 4.4 oz) 85.4 kg (188 lb 4.4 oz)   SpO2:  98% 100% 94%     Weight change:  Filed Weights   09/30/13 0219 09/30/13 0620  Weight: 85.4 kg (188 lb 4.4 oz) 85.4 kg (188 lb 4.4 oz)   Body mass index is 29.48 kg/(m^2).   Gen Exam: Awake and alert with clear speech.  Neck: Supple, No JVD.   Chest: B/L Clear.   CVS: S1 S2 Regular, no murmurs.  Abdomen: soft, BS +, non tender, non distended.  Extremities: no edema, lower extremities warm to touch. Neurologic: B/L lower ext 3+/5-chronic per patient-?b/l foot drop Skin: No Rash. Wounds: N/A.   Intake/Output from previous day: No intake  or output data in the 24 hours ending 09/30/13 1243   LAB RESULTS: CBC  Recent Labs Lab 09/29/13 2029 09/30/13 0321  WBC 5.8 5.0  HGB 12.5 10.4*  HCT 34.9* 30.0*  PLT 156 126*  MCV 78.4 80.6  MCH 28.1 28.0  MCHC 35.8 34.7  RDW 13.0 13.0  LYMPHSABS  --  1.6  MONOABS  --  0.4  EOSABS  --  0.1  BASOSABS  --  0.0    Chemistries   Recent Labs Lab 09/29/13 2029 09/30/13 0321  NA 136* 136*  K 4.4 3.7  CL 99 99  CO2 20 24  GLUCOSE 365* 411*  BUN 22 23  CREATININE 1.02 1.19*  CALCIUM 9.5 8.7    CBG:  Recent Labs Lab 09/29/13 1524 09/29/13 1921 09/30/13 0056 09/30/13 0748 09/30/13 1205  GLUCAP 310* 353* 389* 319* 232*    GFR Estimated Creatinine Clearance: 51.5 ml/min (by C-G formula based on Cr of 1.19).  Coagulation profile  Recent Labs Lab 09/30/13 0128  INR 1.82*    Cardiac Enzymes No results found for this basename: CK, CKMB, TROPONINI, MYOGLOBIN,  in the last 168 hours  No components found with this basename: POCBNP,  No results found for this basename: DDIMER,  in the last 72 hours No results found for this basename: HGBA1C,  in the last 72 hours No results found for this basename: CHOL, HDL, LDLCALC, TRIG, CHOLHDL, LDLDIRECT,  in the last 72 hours No results found for this basename: TSH, T4TOTAL, FREET3, T3FREE, THYROIDAB,  in the last 72 hours No results found for this basename: VITAMINB12, FOLATE, FERRITIN, TIBC, IRON, RETICCTPCT,  in the last 72 hours No results found for this basename: LIPASE, AMYLASE,  in the last 72 hours  Urine Studies No results found for this basename: UACOL, UAPR, USPG, UPH, UTP, UGL, UKET, UBIL, UHGB, UNIT, UROB, ULEU, UEPI, UWBC, URBC, UBAC, CAST, CRYS, UCOM, BILUA,  in the last 72 hours  MICROBIOLOGY: Recent Results (from the past 240 hour(s))  MRSA PCR SCREENING     Status: Abnormal   Collection Time    09/30/13  2:37 AM      Result Value Ref Range Status   MRSA by PCR POSITIVE (*) NEGATIVE Final    Comment:            The GeneXpert MRSA Assay (FDA     approved for NASAL specimens     only), is one component of a     comprehensive MRSA colonization     surveillance program. It is not     intended to diagnose MRSA     infection nor to guide or     monitor treatment for     MRSA infections.     RESULT CALLED TO, READ BACK BY AND VERIFIED WITH:     S.COLUMBRES,RN 0431 09/30/13 M.CAMPBELL  RADIOLOGY STUDIES/RESULTS: Dg Foot Complete Left  09/29/2013   CLINICAL DATA:  Sore on posterior heel with no injury, infected ulcer, diabetes  EXAM: LEFT FOOT - COMPLETE 3+ VIEW  COMPARISON:  None.  FINDINGS: Diffuse osteopenia. No evidence of periosteal reaction. No evidence of cortical destruction. Extensive vascular calcification. No fracture or dislocation.  IMPRESSION: No acute findings.  No radiographic evidence of osteomyelitis.   Electronically Signed   By: Esperanza Heir M.D.   On: 09/29/2013 23:24    Jeoffrey Massed, MD  [facility] Pager:[phone]  If 7PM-7AM, please contact night-coverage [email or web address] Password [email or web address] 09/30/2013, 12:43 PM   LOS: 1 day   **Disclaimer: This note may have been dictated with voice recognition software. Similar sounding words can inadvertently be transcribed and this note may contain transcription errors which may not have been corrected upon publication of note.**

## 2013-09-30 NOTE — Clinical Social Work Placement (Signed)
Clinical Social Work Department CLINICAL SOCIAL WORK PLACEMENT NOTE 09/30/2013  Patient:  Katie Mullins, Katie Mullins  Account Number:  [id number] Admit date:  09/29/2013  Clinical Social Worker:  Cherre Blanc, [place]  Date/time:  09/30/2013 02:14 PM  Clinical Social Work is seeking post-discharge placement for this patient at the following level of care:   SKILLED NURSING   (*CSW will update this form in Epic as items are completed)   09/30/2013  Patient/family provided with [facility] Department of Clinical Social Work's list of facilities offering this level of care within the geographic area requested by the patient (or if unable, by the patient's family).  09/30/2013  Patient/family informed of their freedom to choose among providers that offer the needed level of care, that participate in Medicare, Medicaid or managed care program needed by the patient, have an available bed and are willing to accept the patient.  09/30/2013  Patient/family informed of [facility]' ownership interest in [facility], as well as of the fact that they are under no obligation to receive care at this facility.  PASARR submitted to EDS on 09/30/2013 PASARR number received on 09/30/2013  FL2 transmitted to all facilities in geographic area requested by pt/family on  09/30/2013 FL2 transmitted to all facilities within larger geographic area on   Patient informed that his/her managed care company has contracts with or will negotiate with  certain facilities, including the following:     Patient/family informed of bed offers received:   Patient chooses bed at  Physician recommends and patient chooses bed at    Patient to be transferred to  on   Patient to be transferred to facility by  Patient and family notified of transfer on  Name of family member notified:    The following physician request were entered in Epic:   Additional Comments:    Roddie Mc MSW, [place], [place], [phone]

## 2013-09-30 NOTE — Progress Notes (Signed)
INITIAL NUTRITION ASSESSMENT  DOCUMENTATION CODES Per approved criteria  -Non-severe (moderate) malnutrition in the context of chronic illness   INTERVENTION: Glucerna Shake po TID, each supplement provides 220 kcal and 10 grams of protein  NUTRITION DIAGNOSIS: Inadequate oral intake related to decreased appetite as evidenced by diet hx, increased nutritional needs for wound healing.   Goal: Pt will meet >90% of estimated nutritional needs  Monitor:  Po/supplement intake, labs, weight changes, I/O's  Reason for Assessment: MST=3  68 y.o. female  Admitting Dx: Left foot infection  Katie Mullins is a 68 y.o. 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  ASSESSMENT: Pt admitted for diabetic foot ulcers.  She reports a difficult social situation due to recent transition from a nursing home near [place], [place]. She currently stays in an extended stay motel and is having a difficult time caring for herself. She has support from her son, however, he is also in the process of moving to the [place] are from [facility]. She reports that she needs to go back to a nursing home. CSW has initiated SNF bed search.  She reports poor appetite over the past week. She reveals a sporadic meal schedule due to functional decline. She confirms weight fluctuations due to fluid retention and loss. She reveals UBW of 155-165#, but notes she weighed as much as 210# during periods of fluid overload.  Pt also has several diabetic ulcers, which she has had long term. She reports she did not have any oral nutrition supplements at previous nursing home, "just a bunch of vitamins". She is agreeable to trying a supplement. Discussed importance of good PO intake to promote healing and importance of good glycemic control to facilitate wound healing. Pt very grateful for  visit.  Nutrition Focused Physical Exam:  Subcutaneous Fat:  Orbital Region: mild depletion Upper Arm Region: mild depletion Thoracic and Lumbar Region: WDL  Muscle:  Temple Region: mild depletion Clavicle Bone Region: WDL Clavicle and Acromion Bone Region: WDL Scapular Bone Region: WDL Dorsal Hand: mild depletion Patellar Region: WDL Anterior Thigh Region: WDL Posterior Calf Region: WDL  Edema: none present  Height: Ht Readings from Last 1 Encounters:  09/30/13  (1.702 m)    Weight: Wt Readings from Last 1 Encounters:  09/30/13 188 lb 4.4 oz (85.4 kg)    Ideal Body Weight: 135#  % Ideal Body Weight: 139%  Wt Readings from Last 10 Encounters:  09/30/13 188 lb 4.4 oz (85.4 kg)    Usual Body Weight: 160#  % Usual Body Weight: 118%  BMI:  Body mass index is 29.48 kg/(m^2). Overweight  Estimated Nutritional Needs: Kcal: 2000-2200 Protein: 103-113 grams Fluid: 2.0-2.2 L  Skin: diabetic ulcer left heel, diabetic ulcer rt leg, open wound on lt toe  Diet Order: Carb Control  EDUCATION NEEDS: -Education needs addressed  No intake or output data in the 24 hours ending 09/30/13 0930  Last BM: 09/29/13  Labs:   Recent Labs Lab 09/29/13 2029 09/30/13 0321  NA 136* 136*  K 4.4 3.7  CL 99 99  CO2 20 24  BUN 22 23  CREATININE 1.02 1.19*  CALCIUM 9.5 8.7  GLUCOSE 365* 411*    CBG (last 3)   Recent Labs  09/29/13 1921 09/30/13 0056 09/30/13 0748  GLUCAP 353* 389* 319*    Scheduled Meds: . Chlorhexidine Gluconate Cloth  6  each Topical [id number]  . [START ON 10/01/2013] Influenza vac split quadrivalent PF  0.5 mL Intramuscular Tomorrow-1000  . insulin aspart  0-15 Units Subcutaneous TID WC  . insulin glargine  15 Units Subcutaneous QHS  . lisinopril  10 mg Oral Daily  . metroNIDAZOLE  500 mg Oral 3 times per day  . mupirocin ointment  1 application Nasal BID  . pantoprazole  40 mg Oral Daily  . Rivaroxaban  15 mg Oral Q breakfast  . sodium  chloride  3 mL Intravenous Q12H  . vancomycin  1,000 mg Intravenous Q12H    Continuous Infusions:   Past Medical History  Diagnosis Date  . Hypertension   . Diabetes mellitus without complication   . COPD (chronic obstructive pulmonary disease)   . Anemia   . Hip fracture, right   . Hip fracture, left   . Pelvic fracture     Past Surgical History  Procedure Laterality Date  . Knee surgery      bilateral  . Carpal tunnel release    . Foot surgery    . Rib fracture surgery    . Rt. & lt hip      Katie Mullins A. Katie Mullins, RD, LDN Pager: [phone] After hours Pager: [phone]

## 2013-09-30 NOTE — Discharge Instructions (Signed)
Information on my medicine - XARELTO (rivaroxaban)  This medication education was reviewed with me or my healthcare representative as part of my discharge preparation.   WHY WAS XARELTO PRESCRIBED FOR YOU? Xarelto was prescribed to treat blood clots that may have been found in the veins of your legs (deep vein thrombosis) or in your lungs (pulmonary embolism) and to reduce the risk of them occurring again.  DO NOT stop taking Xarelto without talking to the health care provider who prescribed the medication.  Refill your prescription for 20 mg tablets before you run out.  After discharge, you should have regular check-up appointments with your healthcare provider that is prescribing your Xarelto.  In the future your dose may need to be changed if your kidney function changes by a significant amount.  What do you do if you miss a dose? If you are taking Xarelto TWICE DAILY and you miss a dose, take it as soon as you remember. You may take two 15 mg tablets (total 30 mg) at the same time then resume your regularly scheduled 15 mg twice daily the next day.  If you are taking Xarelto ONCE DAILY and you miss a dose, take it as soon as you remember on the same day then continue your regularly scheduled once daily regimen the next day. Do not take two doses of Xarelto at the same time.   Important Safety Information Xarelto is a blood thinner medicine that can cause bleeding. You should call your healthcare provider right away if you experience any of the following:   Bleeding from an injury or your nose that does not stop.   Unusual colored urine (red or dark brown) or unusual colored stools (red or black).   Unusual bruising for unknown reasons.   A serious fall or if you hit your head (even if there is no bleeding).  Some medicines may interact with Xarelto and might increase your risk of bleeding while on Xarelto. To help avoid this, consult your healthcare provider or pharmacist prior  to using any new prescription or non-prescription medications, including herbals, vitamins, non-steroidal anti-inflammatory drugs (NSAIDs) and supplements.  This website has more information on Xarelto: VisitDestination.com.br.

## 2013-09-30 NOTE — Progress Notes (Signed)
NURSING PROGRESS NOTE  Shaketta Rill [id number] Admission Data: 09/30/2013 4:58 AM Attending Provider: Lorretta Harp, MD PCP:No PCP Per Patient Code Status: FULL CODE  Jeaneane Adamec is a 68 y.o. female patient admitted from ED:  -No acute distress noted.  -No complaints of shortness of breath.  -No complaints of chest pain.   Cardiac Monitoring: Box # N/A in place. Cardiac monitor yieldsN/A.  Blood pressure 163/86, pulse 96, temperature 99.5 F (37.5 C), temperature source Oral, resp. rate 17, height  (1.702 m), weight 85.4 kg (188 lb 4.4 oz), SpO2 100.00%.   IV Fluids:  IV in place, occlusive dsg intact without redness, IV cath forearm right, condition patent and no redness none.   Allergies:  Cephalexin; Iodine; Levaquin; and Penicillins  Past Medical History:   has a past medical history of Hypertension; Diabetes mellitus without complication; COPD (chronic obstructive pulmonary disease); Anemia; Hip fracture, right; Hip fracture, left; and Pelvic fracture.  Past Surgical History:   has past surgical history that includes Knee surgery; Carpal tunnel release; Foot surgery; Rib fracture surgery; and rt. & lt hip.  Social History:   reports that she has never smoked. She does not have any smokeless tobacco history on file. She reports that she does not drink alcohol.  Skin: DIABETIC ULCER Left heel  & rt.lower leg  Patient/Family orientated to room. Information packet given to patient/family. Admission inpatient armband information verified with patient/family to include name and date of birth and placed on patient arm. Side rails up x 2, fall assessment and education completed with patient/family. Patient/family able to verbalize understanding of risk associated with falls and verbalized understanding to call for assistance before getting out of bed. Call light within reach. Patient/family able to voice and demonstrate understanding of unit orientation instructions.    Will continue to  evaluate and treat per MD orders.

## 2013-09-30 NOTE — Progress Notes (Signed)
Inpatient Diabetes Program Recommendations  AACE/ADA: New Consensus Statement on Inpatient Glycemic Control (2013)  Target Ranges:  Prepandial:   less than 140 mg/dL      Peak postprandial:   less than 180 mg/dL (1-2 hours)      Critically ill patients:  140 - 180 mg/dL   Results for Katie Mullins, Katie Mullins (MRN [id number]) as of 09/30/2013 12:32  Ref. Range 09/29/2013 15:24 09/29/2013 19:21 09/30/2013 00:56 09/30/2013 07:48 09/30/2013 12:05  Glucose-Capillary Latest Range: 70-99 mg/dL 782 (H) 956 (H) 213 (H) 319 (H) 232 (H)   Reason for assessment: elevated blood sugars  Diabetes history: Type 2 Outpatient Diabetes medications: Lantus 15 units at bed, Novolog 0-15 units with meals Current orders for Inpatient glycemic control: Lantus 15 units at bed, Novolog 0-15 units with meals  May want to consider increasing Lantus to 20 units q day based on current CBG, likely will need more.  Patient states she was taking Lantus 15 units bid while she was in the nursing home. Please consider ordering an A1C.   Susette Racer, RN, BA, MHA, CDE Diabetes Coordinator Inpatient Diabetes Program  [phone] (Team Pager) [phone] [facility]) 09/30/2013 12:59 PM

## 2013-09-30 NOTE — Progress Notes (Signed)
Echocardiogram 2D Echocardiogram has been performed.  Dorothey Baseman 09/30/2013, 11:13 AM

## 2013-09-30 NOTE — Clinical Social Work Psychosocial (Signed)
Clinical Social Work Department BRIEF PSYCHOSOCIAL ASSESSMENT 09/30/2013  Patient:  Katie Mullins, Katie Mullins     Account Number:  [id number]     Admit date:  09/29/2013  Clinical Social Worker:  Lovey Newcomer  Date/Time:  09/30/2013 02:06 PM  Referred by:  Physician  Date Referred:  09/30/2013 Referred for  SNF Placement   Other Referral:   Interview type:  Patient Other interview type:   Patient alert and oriented at time of assessment.    PSYCHOSOCIAL DATA Living Status:  ALONE Admitted from facility:   Level of care:   Primary support name:  Son Primary support relationship to patient:  CHILD, ADULT Degree of support available:   Patient has a son that lives in [place], [place]. Support is fair.    CURRENT CONCERNS Current Concerns  Post-Acute Placement   Other Concerns:   NA    SOCIAL WORK ASSESSMENT / PLAN CSW met with patient at bedside to complete assessment. Patient states that she has been staying at an extended stay hotel since coming [place] from a SNF in [place]. Patient reports that she wants to DC to [facility] and states that she has been working with the admissions coordinator to set this up, prior to this admission. CSW will confirm this with facility. Patient states that she lived in a SNF in [place] for about 9 months and wanted to try to live on her own again, but understands that she needs SNF placement. Patient appears overwhelmed by all the recent changes that have taken place.CSW explained SNF search/placement process and answered questions. Patient states that she plans to have family/friends assist her with getting her stuff from the hotel.   Assessment/plan status:  Psychosocial Support/Ongoing Assessment of Needs Other assessment/ plan:   Complete Fl2, Fax, PASRR   Information/referral to community resources:   CSW contact information and SNF list given.    PATIENT'S/FAMILY'S RESPONSE TO PLAN OF CARE: Patient  reports that she plans to [place] when stable. CSW will assist.       Liz Beach MSW, [place], [place], [phone]

## 2013-10-01 DIAGNOSIS — L02619 Cutaneous abscess of unspecified foot: Secondary | ICD-10-CM | POA: Diagnosis not present

## 2013-10-01 DIAGNOSIS — L03119 Cellulitis of unspecified part of limb: Secondary | ICD-10-CM | POA: Diagnosis not present

## 2013-10-01 LAB — GLUCOSE, CAPILLARY
GLUCOSE-CAPILLARY: 298 mg/dL — AB (ref 70–99)
GLUCOSE-CAPILLARY: 287 mg/dL — AB (ref 70–99)
Glucose-Capillary: 287 mg/dL — ABNORMAL HIGH (ref 70–99)
Glucose-Capillary: 148 mg/dL — ABNORMAL HIGH (ref 70–99)

## 2013-10-01 MED ORDER — GABAPENTIN 300 MG PO CAPS: 300.0000 mg | ORAL_CAPSULE | Freq: Two times a day (BID) | ORAL | Status: AC

## 2013-10-01 MED ORDER — OXYCODONE-ACETAMINOPHEN 5-325 MG PO TABS: 1.0000 | ORAL_TABLET | ORAL | Status: AC | PRN

## 2013-10-01 MED ORDER — INSULIN ASPART 100 UNIT/ML ~~LOC~~ SOLN
10.0000 [IU] | Freq: Three times a day (TID) | SUBCUTANEOUS | Status: DC
  Administered 2013-10-01 – 2013-10-02 (×5): 10 [IU] via SUBCUTANEOUS

## 2013-10-01 MED ORDER — GABAPENTIN 300 MG PO CAPS
300.0000 mg | ORAL_CAPSULE | Freq: Two times a day (BID) | ORAL | Status: DC
  Administered 2013-10-01 – 2013-10-02 (×3): 300 mg via ORAL
  Filled 2013-10-01 (×4): qty 1

## 2013-10-01 MED ORDER — GLUCERNA SHAKE PO LIQD: 237.0000 mL | Freq: Three times a day (TID) | ORAL | Status: AC

## 2013-10-01 MED ORDER — INSULIN GLARGINE 100 UNIT/ML ~~LOC~~ SOLN
26.0000 [IU] | Freq: Every day | SUBCUTANEOUS | Status: DC
  Administered 2013-10-01: 26 [IU] via SUBCUTANEOUS
  Filled 2013-10-01 (×2): qty 0.26

## 2013-10-01 MED ORDER — INSULIN GLARGINE 100 UNIT/ML ~~LOC~~ SOLN: 26.0000 [IU] | Freq: Every day | SUBCUTANEOUS | Status: AC

## 2013-10-01 MED ORDER — RIVAROXABAN 20 MG PO TABS: 20.0000 mg | ORAL_TABLET | Freq: Every day | ORAL | Status: AC

## 2013-10-01 MED ORDER — INSULIN ASPART 100 UNIT/ML ~~LOC~~ SOLN: 10.0000 [IU] | Freq: Three times a day (TID) | SUBCUTANEOUS | Status: AC

## 2013-10-01 NOTE — Clinical Documentation Improvement (Signed)
Possible Clinical Conditions?   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 It is possible that patient has a chronic congestive heart failure per 9/24 progress notes.   Thank You, Marciano Sequin, Clinical Documentation Specialist:  [phone]  [facility]- Health Information Management

## 2013-10-01 NOTE — Consult Note (Signed)
WOC wound consult note Reason for Consult: evaluation of multiple wounds. Pt has been in a SNF out of town, recently moved to [place] and has been living in a motel. She can ambulate but reports she uses WC frequently. She reports a spider bite in 2009 that resulted in a wound on her right medial shin and that the "dug it out" but did not "get it all" and since this area will heal and then reopen and drain from time to time. She reports the left heel ulcer started while hospitalized and in LTC.  She reports surgical debridements on the heel several different times along with "scraping and cutting to the bone".    Wound type: Stage III; healing pressure ulcer left heel Ulceration; right pretibial, unknown etiology Ulceration; right great toe Pressure Ulcer POA: Yes x 1 Measurement: Left heel: 2.0cm x 4.5cm x 0.2cm  Left great toe: 0.2cm x 0.2cm x 0 Right pretibial medial: appears to be closed, some color changes that are 2.0cm x 1.0cm  Wound bed: Left heel: granulation tissue and some superficial skin peeling, pink, moist, clean Left great toe: closed, dark purple  Right pretibial: closed but with some fragile central skin that apparently weeps from time to time. No active drainage today.  Drainage (amount, consistency, odor) minimal from the heel, no other drainage. Periwound: intact with evidence of healing/scar at the left heel  Dressing procedure/placement/frequency: Hydrogel for the left heel to continue to support moist wound healing.  Prevalon boots for offloading of the left heel.  No topical care recommended for the right great toe or right pretibial area.   Discussed POC with patient and bedside nurse.  Re consult if needed, will not follow at this time. Thanks  Arika Mainer [employer], CWOCN [phone])

## 2013-10-01 NOTE — Progress Notes (Signed)
OT Cancellation Note  Patient Details Name: Katie Mullins MRN: [id number] DOB: 1945/03/02   Cancelled Treatment:    Reason Eval/Treat Not Completed: Other (comment) Pt is Medicare and current D/C plan is SNF. No apparent immediate acute care OT needs, therefore will defer OT to SNF. If OT eval is needed please call Acute Rehab Dept. at [phone] or text page OT at [phone].    Nena Jordan M  Carney Living, OTR/L Occupational Therapist [phone] (pager)  10/01/2013, 4:28 PM

## 2013-10-01 NOTE — Clinical Documentation Improvement (Signed)
Possible Clinical Conditions?   Septicemia / Sepsis Severe Sepsis Neutropenic Sepsis  SIRS Septic Shock Sepsis with UTI Sepsis due to an internal device  Bacterial infection of unknown etiology / source Other Condition  Cannot clinically Determine    Risk Factors: Hold Lasix given that patient is at risk for Sepsis per 9/24 progress notes.   Thank You, Marciano Sequin, Clinical Documentation Specialist:  [phone]  [facility]- Health Information Management

## 2013-10-01 NOTE — Progress Notes (Addendum)
PATIENT DETAILS Name: Katie Mullins Age: 68 y.o. Sex: female Date of Birth: [birth year] Admit Date: 09/29/2013 Admitting Physician Lorretta Harp, MD PCP:No PCP Per Patient  Subjective: No major issues overnight-no major complaints this am  Assessment/Plan: Principal Problem:   Left foot infection - Cellulitis surrounding left heel ulcer, no discharge evident. - Admitted, and started on IV vancomycin/Flagyl-day 2-remains afebrile, clinically improved, will taper to oral antibiotics on discharge. MRI foot negative for osteo/abscess. - Appreciate wound care consultation-recommendations WJX:BJYNWGNF for the left heel to continue to support moist wound healing. Prevalon boots for offloading of the left heel. No topical care recommended for the right great toe or right pretibial area.   Active Problems: Uncontrolled diabetes - Increase Lantus to 26 units, increase scheduled pre-meal NovoLog 10 units 3 times a day, continue with SSI. Follow CBGs, will adjust accordingly. A1C 9.3  History of bilateral  DVT (deep venous thrombosis) - Continues Xarelto  History of Exertional dyspnea -no evidence of acute decompensated CHF on exam, suspect that this is secondary to deconditioning and weakness. 2-D echocardiogram shows preserved ejection fraction- does show grade 1 diastolic dysfunction  Chronic diastolic dysfunction - Clinically compensated, and Lasix will be resumed on discharge  Diabetic neuropathy - Claims to have tingling/numbness in her b/l Foot-will start Neurontin  Generalized weakness - Patient claims that for the past 1-2 years, she has had progressive weakness primarily in her lower extremities and basically gets around a few feet with the help of a walker, and then for longer distances uses a wheelchair. PT eval appreciated-SNF on discharge.   Reported history of COPD - Lungs clear, as needed nebs  Hypertension - Controlled-continue with lisinopril  Non-severe (moderate)  malnutrition in the context of chronic illness   Disposition: Remain inpatient  DVT Prophylaxis: Xarelto  Code Status: Full code  Family Communication None at bedside  Procedures:  None  CONSULTS:  None  Time spent 40 minutes-which includes 50% of the time with face-to-face with patient/ family and coordinating care related to the above assessment and plan.    MEDICATIONS: Scheduled Meds: . Chlorhexidine Gluconate Cloth  6 each Topical Q0600  . feeding supplement (GLUCERNA SHAKE)  237 mL Oral TID BM  . insulin aspart  0-15 Units Subcutaneous TID WC  . insulin aspart  10 Units Subcutaneous TID WC  . insulin glargine  22 Units Subcutaneous QHS  . lisinopril  10 mg Oral Daily  . metroNIDAZOLE  500 mg Oral 3 times per day  . mupirocin ointment  1 application Nasal BID  . pantoprazole  40 mg Oral Daily  . rivaroxaban  20 mg Oral Q supper  . sodium chloride  3 mL Intravenous Q12H  . vancomycin  1,000 mg Intravenous Q12H   Continuous Infusions:  PRN Meds:.sodium chloride, cyclobenzaprine, oxyCODONE-acetaminophen, sodium chloride  Antibiotics: Anti-infectives   Start     Dose/Rate Route Frequency Ordered Stop   09/30/13 1000  vancomycin (VANCOCIN) IVPB 1000 mg/200 mL premix     1,000 mg 200 mL/hr over 60 Minutes Intravenous Every 12 hours 09/30/13 0353     09/30/13 0054  metroNIDAZOLE (FLAGYL) tablet 500 mg     500 mg Oral 3 times per day 09/30/13 0054     09/29/13 2030  vancomycin (VANCOCIN) IVPB 1000 mg/200 mL premix     1,000 mg 200 mL/hr over 60 Minutes Intravenous  Once 09/29/13 2029 09/29/13 2221       PHYSICAL EXAM: Vital signs in  last 24 hours: Filed Vitals:   09/30/13 2345 10/01/13 0500 10/01/13 0614 10/01/13 1103  BP: 150/61  140/62 121/56  Pulse: 95  80   Temp: 98.6 F (37 C)  97.9 F (36.6 C)   TempSrc: Oral  Oral   Resp: 18  18   Height:      Weight:  85 kg (187 lb 6.3 oz)    SpO2: 96%  97%     Weight change: -0.4 kg (-14.1 oz) Filed  Weights   09/30/13 0219 09/30/13 0620 10/01/13 0500  Weight: 85.4 kg (188 lb 4.4 oz) 85.4 kg (188 lb 4.4 oz) 85 kg (187 lb 6.3 oz)   Body mass index is 29.34 kg/(m^2).   Gen Exam: Awake and alert with clear speech.  Neck: Supple, No JVD.   Chest: B/L Clear.   CVS: S1 S2 Regular, no murmurs.  Abdomen: soft, BS +, non tender, non distended.  Extremities: no edema, lower extremities warm to touch. Neurologic: B/L lower ext 4/5-chronic per patient Skin: No Rash. Wounds: N/A.   Intake/Output from previous day:  Intake/Output Summary (Last 24 hours) at 10/01/13 1129 Last data filed at 10/01/13 1000  Gross per 24 hour  Intake    680 ml  Output      0 ml  Net    680 ml     LAB RESULTS: CBC  Recent Labs Lab 09/29/13 2029 09/30/13 0321  WBC 5.8 5.0  HGB 12.5 10.4*  HCT 34.9* 30.0*  PLT 156 126*  MCV 78.4 80.6  MCH 28.1 28.0  MCHC 35.8 34.7  RDW 13.0 13.0  LYMPHSABS  --  1.6  MONOABS  --  0.4  EOSABS  --  0.1  BASOSABS  --  0.0    Chemistries   Recent Labs Lab 09/29/13 2029 09/30/13 0321  NA 136* 136*  K 4.4 3.7  CL 99 99  CO2 20 24  GLUCOSE 365* 411*  BUN 22 23  CREATININE 1.02 1.19*  CALCIUM 9.5 8.7    CBG:  Recent Labs Lab 09/30/13 0748 09/30/13 1205 09/30/13 1937 09/30/13 2342 10/01/13 0802  GLUCAP 319* 232* 219* 334* 287*    GFR Estimated Creatinine Clearance: 51.4 ml/min (by C-G formula based on Cr of 1.19).  Coagulation profile  Recent Labs Lab 09/30/13 0128  INR 1.82*    Cardiac Enzymes No results found for this basename: CK, CKMB, TROPONINI, MYOGLOBIN,  in the last 168 hours  No components found with this basename: POCBNP,  No results found for this basename: DDIMER,  in the last 72 hours  Recent Labs  09/30/13 0321  HGBA1C 9.3*   No results found for this basename: CHOL, HDL, LDLCALC, TRIG, CHOLHDL, LDLDIRECT,  in the last 72 hours No results found for this basename: TSH, T4TOTAL, FREET3, T3FREE, THYROIDAB,  in the last  72 hours No results found for this basename: VITAMINB12, FOLATE, FERRITIN, TIBC, IRON, RETICCTPCT,  in the last 72 hours No results found for this basename: LIPASE, AMYLASE,  in the last 72 hours  Urine Studies No results found for this basename: UACOL, UAPR, USPG, UPH, UTP, UGL, UKET, UBIL, UHGB, UNIT, UROB, ULEU, UEPI, UWBC, URBC, UBAC, CAST, CRYS, UCOM, BILUA,  in the last 72 hours  MICROBIOLOGY: Recent Results (from the past 240 hour(s))  CULTURE, BLOOD (ROUTINE X 2)     Status: None   Collection Time    09/30/13  1:08 AM      Result Value Ref Range Status  Specimen Description BLOOD LEFT ARM   Final   Special Requests     Final   Value: BOTTLES DRAWN AEROBIC AND ANAEROBIC 5CC PATIENT ON FOLLOWING VANCOMYCIN   Culture  Setup Time     Final   Value: 09/30/2013 08:45     Performed at [facility]   Culture     Final   Value:        BLOOD CULTURE RECEIVED NO GROWTH TO DATE CULTURE WILL BE HELD FOR 5 DAYS BEFORE ISSUING A FINAL NEGATIVE REPORT     Performed at [facility]   Report Status PENDING   Incomplete  CULTURE, BLOOD (ROUTINE X 2)     Status: None   Collection Time    09/30/13  1:28 AM      Result Value Ref Range Status   Specimen Description BLOOD LEFT HAND   Final   Special Requests     Final   Value: BOTTLES DRAWN AEROBIC AND ANAEROBIC 5CC EACH PATIENT ON FOLLOWING VANCOMYCIN   Culture  Setup Time     Final   Value: 09/30/2013 08:45     Performed at [facility]   Culture     Final   Value:        BLOOD CULTURE RECEIVED NO GROWTH TO DATE CULTURE WILL BE HELD FOR 5 DAYS BEFORE ISSUING A FINAL NEGATIVE REPORT     Performed at [facility]   Report Status PENDING   Incomplete  MRSA PCR SCREENING     Status: Abnormal   Collection Time    09/30/13  2:37 AM      Result Value Ref Range Status   MRSA by PCR POSITIVE (*) NEGATIVE Final   Comment:            The GeneXpert MRSA Assay (FDA     approved for NASAL specimens     only), is one  component of a     comprehensive MRSA colonization     surveillance program. It is not     intended to diagnose MRSA     infection nor to guide or     monitor treatment for     MRSA infections.     RESULT CALLED TO, READ BACK BY AND VERIFIED WITH:     S.COLUMBRES,RN 0431 09/30/13 M.CAMPBELL    RADIOLOGY STUDIES/RESULTS: Dg Foot Complete Left  09/29/2013   CLINICAL DATA:  Sore on posterior heel with no injury, infected ulcer, diabetes  EXAM: LEFT FOOT - COMPLETE 3+ VIEW  COMPARISON:  None.  FINDINGS: Diffuse osteopenia. No evidence of periosteal reaction. No evidence of cortical destruction. Extensive vascular calcification. No fracture or dislocation.  IMPRESSION: No acute findings.  No radiographic evidence of osteomyelitis.   Electronically Signed   By: Esperanza Heir M.D.   On: 09/29/2013 23:24    Jeoffrey Massed, MD  [facility] Pager:[phone]  If 7PM-7AM, please contact night-coverage [email or web address] Password [email or web address] 10/01/2013, 11:29 AM   LOS: 2 days   **Disclaimer: This note may have been dictated with voice recognition software. Similar sounding words can inadvertently be transcribed and this note may contain transcription errors which may not have been corrected upon publication of note.**

## 2013-10-01 NOTE — Discharge Summary (Signed)
PATIENT DETAILS Name: Katie Mullins Age: 68 y.o. Sex: female Date of Birth: 04/11/1945 MRN: [id number]. Admitting Physician: Lorretta Harp, MD PCP:No PCP Per Patient  Admit Date: 09/29/2013 Discharge date: 10/02/2013  Recommendations for Outpatient Follow-up:  1. Please monitor CBC and chemistries periodically-on Xarelto  PRIMARY DISCHARGE DIAGNOSIS:  Principal Problem:   Left foot infection Active Problems:   DVT (deep venous thrombosis)   Hypertension   Diabetes mellitus without complication   COPD (chronic obstructive pulmonary disease)   Bilateral leg edema   Malnutrition of moderate degree      PAST MEDICAL HISTORY: Past Medical History  Diagnosis Date  . Hypertension   . Diabetes mellitus without complication   . COPD (chronic obstructive pulmonary disease)   . Anemia   . Hip fracture, right   . Hip fracture, left   . Pelvic fracture     DISCHARGE MEDICATIONS:   Medication List    STOP taking these medications       insulin lispro 100 UNIT/ML injection  Commonly known as:  HUMALOG      TAKE these medications       cyclobenzaprine 5 MG tablet  Commonly known as:  FLEXERIL  Take 5 mg by mouth 3 (three) times daily as needed for muscle spasms.     feeding supplement (GLUCERNA SHAKE) Liqd  Take 237 mLs by mouth 3 (three) times daily between meals.     furosemide 40 MG tablet  Commonly known as:  LASIX  Take 40 mg by mouth daily.     gabapentin 300 MG capsule  Commonly known as:  NEURONTIN  Take 1 capsule (300 mg total) by mouth 2 (two) times daily.     insulin aspart 100 UNIT/ML injection  Commonly known as:  novoLOG  Inject 10 Units into the skin 3 (three) times daily with meals.     insulin glargine 100 UNIT/ML injection  Commonly known as:  LANTUS  Inject 0.26 mLs (26 Units total) into the skin at bedtime.     lisinopril 10 MG tablet  Commonly known as:  PRINIVIL,ZESTRIL  Take 10 mg by mouth daily.     omeprazole 20 MG capsule  Commonly  known as:  PRILOSEC  Take 20 mg by mouth daily.     oxyCODONE-acetaminophen 5-325 MG per tablet  Commonly known as:  PERCOCET/ROXICET  Take 1 tablet by mouth every 4 (four) hours as needed for moderate pain or severe pain.     potassium chloride 10 MEQ tablet  Commonly known as:  K-DUR,KLOR-CON  Take 10 mEq by mouth 2 (two) times daily.     rivaroxaban 20 MG Tabs tablet  Commonly known as:  XARELTO  Take 1 tablet (20 mg total) by mouth daily with supper.        ALLERGIES:   Allergies  Allergen Reactions  . Cephalexin   . Iodine   . Levaquin [Levofloxacin In D5w]   . Penicillins     anaphylaxis    BRIEF HPI:  See H&P, Labs, Consult and Test reports for all details in brief, is a 68 y.o. 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  CONSULTATIONS:   None  PERTINENT RADIOLOGIC STUDIES: Mr Foot Left W Wo Contrast  10/01/2013   CLINICAL DATA:  Deep heel ulcer with cellulitis.  EXAM: MRI OF THE LEFT HINDFOOT WITHOUT AND WITH CONTRAST  TECHNIQUE: Multiplanar, multisequence MR imaging of the ankle was performed before and after the administration of intravenous contrast.  CONTRAST:  17mL MULTIHANCE GADOBENATE DIMEGLUMINE 529 MG/ML IV SOLN  COMPARISON:  09/29/2013  FINDINGS: Large ulcer of the medial heel noted with abnormal edema and enhancement along the posterior -inferior -medial hindfoot. Abnormal enhancing tissue extends to the proximal medial band of the plantar fascia which itself demonstrates thickening, indistinctness, and enhancement on images 7-13 of series 12. No gas is observed in the regional soft tissues.  TENDONS  Peroneal: Intact  Posteromedial: Intact  Anterior: Intact  Achilles: Mild distal tendinopathy  Plantar Fascia: Thickening and potential partial disruption of the proximal medial band due to the  adjacent inflammatory process, best appreciated on the post-contrast images.  LIGAMENTS  Lateral: Intact  Medial: Intact  CARTILAGE  Ankle Joint: Intact  Subtalar Joints/Sinus Tarsi: Intact  Bones: Suspected old calcaneal fracture with some remote inferior collapse of the central calcaneus. The MRI equivalent of Boehler's angle is abnormally reduced. No findings of osteomyelitis. Plantar calcaneal spur.  IMPRESSION: IMPRESSION 1. Ulceration along the medial -inferior heel with abnormal enhancement compatible with local cellulitis. Abnormal enhancement extends to the proximal segment of the medial band of the plantar fascia which is thickened and enhancing, probably due to localized inflammation/plantar fasciitis related to the ulcer, and less likely due to a remote injury. 2. Old healed calcaneal fracture with some central collapse of the calcaneus. 3. Mild distal Achilles tendinopathy. 4. No discrete abscess or osteomyelitis.   Electronically Signed   By: Herbie Baltimore M.D.   On: 10/01/2013 08:29   Dg Foot Complete Left  09/29/2013   CLINICAL DATA:  Sore on posterior heel with no injury, infected ulcer, diabetes  EXAM: LEFT FOOT - COMPLETE 3+ VIEW  COMPARISON:  None.  FINDINGS: Diffuse osteopenia. No evidence of periosteal reaction. No evidence of cortical destruction. Extensive vascular calcification. No fracture or dislocation.  IMPRESSION: No acute findings.  No radiographic evidence of osteomyelitis.   Electronically Signed   By: Esperanza Heir M.D.   On: 09/29/2013 23:24     PERTINENT LAB RESULTS: CBC:  Recent Labs  09/29/13 2029 09/30/13 0321  WBC 5.8 5.0  HGB 12.5 10.4*  HCT 34.9* 30.0*  PLT 156 126*   CMET CMP     Component Value Date/Time   NA 136* 09/30/2013 0321   K 3.7 09/30/2013 0321   CL 99 09/30/2013 0321   CO2 24 09/30/2013 0321   GLUCOSE 411* 09/30/2013 0321   BUN 23 09/30/2013 0321   CREATININE 1.19* 09/30/2013 0321   CALCIUM 8.7 09/30/2013 0321   PROT 5.9* 09/30/2013  0321   ALBUMIN 3.3* 09/30/2013 0321   AST 16 09/30/2013 0321   ALT 16 09/30/2013 0321   ALKPHOS 128* 09/30/2013 0321   BILITOT 0.5 09/30/2013 0321   GFRNONAA 46* 09/30/2013 0321   GFRAA 54* 09/30/2013 0321    GFR Estimated Creatinine Clearance: 51.4 ml/min (by C-G formula based on Cr of 1.19). No results found for this basename: LIPASE, AMYLASE,  in the last 72 hours No results found for this basename: CKTOTAL, CKMB, CKMBINDEX, TROPONINI,  in the last 72 hours No components found with this basename: POCBNP,  No results found for this basename: DDIMER,  in the last 72 hours  Recent Labs  09/30/13 0321  HGBA1C 9.3*   No results found for this basename: CHOL, HDL, LDLCALC, TRIG, CHOLHDL, LDLDIRECT,  in the last 72 hours No results found for this basename:  TSH, T4TOTAL, FREET3, T3FREE, THYROIDAB,  in the last 72 hours No results found for this basename: VITAMINB12, FOLATE, FERRITIN, TIBC, IRON, RETICCTPCT,  in the last 72 hours Coags:  Recent Labs  09/30/13 0128  INR 1.82*   Microbiology: Recent Results (from the past 240 hour(s))  CULTURE, BLOOD (ROUTINE X 2)     Status: None   Collection Time    09/30/13  1:08 AM      Result Value Ref Range Status   Specimen Description BLOOD LEFT ARM   Final   Special Requests     Final   Value: BOTTLES DRAWN AEROBIC AND ANAEROBIC 5CC PATIENT ON FOLLOWING VANCOMYCIN   Culture  Setup Time     Final   Value: 09/30/2013 08:45     Performed at [facility]   Culture     Final   Value:        BLOOD CULTURE RECEIVED NO GROWTH TO DATE CULTURE WILL BE HELD FOR 5 DAYS BEFORE ISSUING A FINAL NEGATIVE REPORT     Performed at [facility]   Report Status PENDING   Incomplete  CULTURE, BLOOD (ROUTINE X 2)     Status: None   Collection Time    09/30/13  1:28 AM      Result Value Ref Range Status   Specimen Description BLOOD LEFT HAND   Final   Special Requests     Final   Value: BOTTLES DRAWN AEROBIC AND ANAEROBIC 5CC EACH PATIENT ON  FOLLOWING VANCOMYCIN   Culture  Setup Time     Final   Value: 09/30/2013 08:45     Performed at [facility]   Culture     Final   Value:        BLOOD CULTURE RECEIVED NO GROWTH TO DATE CULTURE WILL BE HELD FOR 5 DAYS BEFORE ISSUING A FINAL NEGATIVE REPORT     Performed at [facility]   Report Status PENDING   Incomplete  MRSA PCR SCREENING     Status: Abnormal   Collection Time    09/30/13  2:37 AM      Result Value Ref Range Status   MRSA by PCR POSITIVE (*) NEGATIVE Final   Comment:            The GeneXpert MRSA Assay (FDA     approved for NASAL specimens     only), is one component of a     comprehensive MRSA colonization     surveillance program. It is not     intended to diagnose MRSA     infection nor to guide or     monitor treatment for     MRSA infections.     RESULT CALLED TO, READ BACK BY AND VERIFIED WITH:     S.COLUMBRES,RN 0431 09/30/13 M.CAMPBELL     BRIEF HOSPITAL COURSE:   Principal Problem: Left foot infection  - Cellulitis surrounding left heel ulcer, no discharge evident.  - Admitted, and started on IV vancomycin/Flagyl-currently day3-remains afebrile, clinically improved, will taper to oral Doxycycline on discharge. MRI foot negative for osteo/abscess.  - Appreciate wound care consultation-recommendations ZOX:[id number] for the left heel to continue to support moist wound healing. Prevalon boots for offloading of the left heel. No topical care recommended for the right great toe or right pretibial area.   Active Problems: Uncontrolled diabetes  - Have increased Lantus to 26 units, increased scheduled pre-meal NovoLog 10 units 3 times a day, continue with SSI while  at the skilled nursing facility and adjust accordingly. A1C 9.3  History of bilateral DVT (deep venous thrombosis)  - Continue Xarelto- please check periodic CBC and chemistries were not skilled nursing facility.  History of Exertional dyspnea  -no evidence of acute  decompensated CHF on exam, suspect that this is secondary to deconditioning and weakness. 2-D echocardiogram shows preserved ejection fraction- does show grade 1 diastolic dysfunction   Chronic diastolic dysfunction  - Clinically compensated, and Lasix will be resumed on discharge   Diabetic neuropathy  - Claims to have tingling/numbness in her b/l Foot-will start Neurontin   Generalized weakness  - Patient claims that for the past 1-2 years, she has had progressive weakness primarily in her lower extremities and basically gets around a few feet with the help of a walker, and then for longer distances uses a wheelchair. PT eval appreciated-SNF on discharge.   Reported history of COPD  - Lungs clear, as needed nebs   Chronic lower extremity wounds -Were present  on admission - See above for wound care recommendations  Hypertension  - Controlled-continue with lisinopril   Non-severe (moderate) malnutrition in the context of chronic illness   TODAY-DAY OF DISCHARGE:  Subjective:   Dallas Schimke today has no headache,no chest abdominal pain,no new weakness tingling or numbness, feels much better wants to go home today.   Objective:   Blood pressure 121/56, pulse 80, temperature 97.9 F (36.6 C), temperature source Oral, resp. rate 18, height  (1.702 m), weight 85 kg (187 lb 6.3 oz), SpO2 97.00%.  Intake/Output Summary (Last 24 hours) at 10/01/13 1339 Last data filed at 10/01/13 1000  Gross per 24 hour  Intake    460 ml  Output      0 ml  Net    460 ml   Filed Weights   09/30/13 0219 09/30/13 0620 10/01/13 0500  Weight: 85.4 kg (188 lb 4.4 oz) 85.4 kg (188 lb 4.4 oz) 85 kg (187 lb 6.3 oz)    Exam Awake Alert, Oriented *3, No new F.N deficits, Normal affect [place].AT,PERRAL Supple Neck,No JVD, No cervical lymphadenopathy appriciated.  Symmetrical Chest wall movement, Good air movement bilaterally, CTAB RRR,No Gallops,Rubs or new Murmurs, No Parasternal Heave +ve B.Sounds,  Abd Soft, Non tender, No organomegaly appriciated, No rebound -guarding or rigidity. No Cyanosis, Clubbing or edema, No new Rash or bruise Wounds:  Left heel: 2.0cm x 4.5cm x 0.2cm   Left great toe: 0.2cm x 0.2cm x 0  Right pretibial medial: appears to be closed, some color changes that are 2.0cm x 1.0cm    DISCHARGE CONDITION: Stable  DISPOSITION: SNF  DISCHARGE INSTRUCTIONS:    Activity:  As tolerated with Full fall precautions use walker/cane & assistance as needed  Diet recommendation: Diabetic Diet Heart Healthy diet       Discharge Instructions   Call MD for:  redness, tenderness, or signs of infection (pain, swelling, redness, odor or green/yellow discharge around incision site)    Complete by:  As directed      Diet - low sodium heart healthy    Complete by:  As directed      Diet Carb Modified    Complete by:  As directed      Discharge wound care:    Complete by:  As directed   Hydrogel for the left heel to continue to support moist wound healing. Prevalon boots for offloading of the left heel. No topical care recommended for the right great toe or right  pretibial area.     Increase activity slowly    Complete by:  As directed            Follow-up Information   Follow up In 1 week.   Contact information:   PCP       Total Time spent on discharge equals 45 minutes.  SignedJeoffrey Massed 10/01/2013 1:39 PM  **Disclaimer: This note may have been dictated with voice recognition software. Similar sounding words can inadvertently be transcribed and this note may contain transcription errors which may not have been corrected upon publication of note.**

## 2013-10-01 NOTE — Evaluation (Addendum)
Physical Therapy Evaluation Patient Details Name: Katie Mullins MRN: [id number] DOB: 23-Jun-1945 Today's Date: 10/01/2013   History of Present Illness  Katie Mullins is a 68 y.o. female with past medical history of hypertension, type 2 diabetes, COPD, recent bilateral hip fractures (s/p surgery), history of recurrent DVT,  chronic leg edema, who presents with foot ulcers.   Clinical Impression  Pt is in bed when PT arrived, having some shooting pain in L heel that doesn't relate to activity.  Pt is expecting to go to rehab as inpatient, agreeable with SNF to manage her skin breakdown and strengthen.  Discharge planning after that will need to be done as her previous motel living situation is not ideal for her.    Follow Up Recommendations SNF;Supervision/Assistance - 24 hour    Equipment Recommendations  None recommended by PT    Recommendations for Other Services Other (comment)     Precautions / Restrictions Precautions Precautions: Fall Precaution Comments: MRSA in her L heel wound Restrictions Weight Bearing Restrictions: No Other Position/Activity Restrictions: Has a Prevalon boot for times not standing/walking      Mobility  Bed Mobility Overal bed mobility: Modified Independent             General bed mobility comments: uses bedrails and cues  Transfers Overall transfer level: Needs assistance Equipment used: Rolling walker (2 wheeled);1 person hand held assist (bathroom railing) Transfers: Sit to/from [employer] Sit to Stand: Min assist Stand pivot transfers: Min assist       General transfer comment: Mod assist in BR from lower surface due to poor LE sensation  Ambulation/Gait Ambulation/Gait assistance: Mod assist;Min assist Ambulation Distance (Feet): 30 Feet Assistive device: Rolling walker (2 wheeled) Gait Pattern/deviations: Decreased step length - left;Decreased step length - right;Step-through pattern;Decreased weight shift to left;Wide  base of support;Drifts right/left Gait velocity: slow Gait velocity interpretation: Below normal speed for age/gender    Stairs            Wheelchair Mobility    Modified Rankin (Stroke Patients Only)       Balance Overall balance assessment: Needs assistance Sitting-balance support: Feet supported;No upper extremity supported Sitting balance-Leahy Scale: Fair   Postural control: Left lateral lean Standing balance support: Bilateral upper extremity supported Standing balance-Leahy Scale: Poor Standing balance comment: numb in her feet and L heel ulcer with some pain                             Pertinent Vitals/Pain Pain Assessment: Faces Faces Pain Scale: Hurts a little bit Pain Location: L heel Pain Descriptors / Indicators: Penetrating (stabbing) Pain Intervention(s): Limited activity within patient's tolerance;Monitored during session;Premedicated before session;Other (comment) (not related to activity)    Home Living Family/patient expects to be discharged to:: Skilled nursing facility                 Additional Comments: Pt reports her son thinks she should not try to stay alone any more    Prior Function Level of Independence: Independent with assistive device(s)         Comments: used wheelchair and RW mainly but lived in [place] with liimited need to cook and clean     Hand Dominance        Extremity/Trunk Assessment                         Communication   Communication: No difficulties  Cognition Arousal/Alertness: Awake/alert Behavior During Therapy: WFL for tasks assessed/performed Overall Cognitive Status: Within Functional Limits for tasks assessed                      General Comments General comments (skin integrity, edema, etc.): Pt has loose bandage on her L heel from MD checking and poor LE sensation, both of which impact security with gait.  Pt is expecting to stay in SNF to fully recover skin and  safety with recent falls and femoral fractures.    Exercises        Assessment/Plan    PT Assessment    PT Diagnosis     PT Problem List    PT Treatment Interventions     PT Goals (Current goals can be found in the Care Plan section) Acute Rehab PT Goals Patient Stated Goal: To get better (strength) PT Goal Formulation: With patient Time For Goal Achievement: 10/08/13 Potential to Achieve Goals: Good    Frequency Min 3X/week   Barriers to discharge        Co-evaluation               End of Session Equipment Utilized During Treatment: Other (comment) (FWW) Activity Tolerance: Patient limited by fatigue Patient left: in chair;with call bell/phone within reach;with chair alarm set;with nursing/sitter in room Nurse Communication: Mobility status;Precautions         Time: 4098-1191 PT Time Calculation (min): 25 min   Charges:   PT Evaluation $Initial PT Evaluation Tier I: 1 Procedure PT Treatments $Gait Training: 8-22 mins   PT G CodesIvar Drape 2013/10/24, 11:11 AM Samul Dada, PT MS Acute Rehab Dept. Number: [phone]

## 2013-10-01 NOTE — Clinical Social Work Note (Signed)
Patient to DC to [facility] tomorrow. Facility will assist patient with getting belongings from hotel on Monday. CSW has left report for weekend CSW.   Roddie Mc MSW, [place], [place], [phone]

## 2013-10-01 NOTE — Plan of Care (Signed)
Problem: Acute Rehab PT Goals(only PT should resolve) Goal: Pt Will Transfer Bed To Chair/Chair To Bed And appropriate hand placement Goal: Pt Will Ambulate Level surfaces

## 2013-10-02 LAB — GLUCOSE, CAPILLARY
Glucose-Capillary: 194 mg/dL — ABNORMAL HIGH (ref 70–99)
Glucose-Capillary: 225 mg/dL — ABNORMAL HIGH (ref 70–99)

## 2013-10-02 NOTE — Clinical Social Work Note (Signed)
CSW continues to follow for d/c planning needs. CSW made aware patient ready for d/c to [place]. CSW contacted [facility] and confirmed bed availability. CSW met with patient who is agreeable to d/c to facility. CSW prepared d/c packet and placed in patient's shadow chart. CSW made RN (Ginger) aware of number for room and report. CSW to arrange transportation via [place]. No further needs. CSW signing off.  [place], [place] Weekend Clinical Social Worker [phone]

## 2013-10-02 NOTE — Progress Notes (Signed)
Nsg Discharge Note  Admit Date:  09/29/2013 Discharge date: 10/02/2013   Dallas Schimke to be D/C'd Rehab per MD order.  AVS completed.  Copy for chart, and copy for patient signed, and dated. Patient/caregiver able to verbalize understanding.  Discharge Medication:   Medication List    STOP taking these medications       insulin lispro 100 UNIT/ML injection  Commonly known as:  HUMALOG      TAKE these medications       cyclobenzaprine 5 MG tablet  Commonly known as:  FLEXERIL  Take 5 mg by mouth 3 (three) times daily as needed for muscle spasms.     feeding supplement (GLUCERNA SHAKE) Liqd  Take 237 mLs by mouth 3 (three) times daily between meals.     furosemide 40 MG tablet  Commonly known as:  LASIX  Take 40 mg by mouth daily.     gabapentin 300 MG capsule  Commonly known as:  NEURONTIN  Take 1 capsule (300 mg total) by mouth 2 (two) times daily.     insulin aspart 100 UNIT/ML injection  Commonly known as:  novoLOG  Inject 10 Units into the skin 3 (three) times daily with meals.     insulin glargine 100 UNIT/ML injection  Commonly known as:  LANTUS  Inject 0.26 mLs (26 Units total) into the skin at bedtime.     lisinopril 10 MG tablet  Commonly known as:  PRINIVIL,ZESTRIL  Take 10 mg by mouth daily.     omeprazole 20 MG capsule  Commonly known as:  PRILOSEC  Take 20 mg by mouth daily.     oxyCODONE-acetaminophen 5-325 MG per tablet  Commonly known as:  PERCOCET/ROXICET  Take 1 tablet by mouth every 4 (four) hours as needed for moderate pain or severe pain.     potassium chloride 10 MEQ tablet  Commonly known as:  K-DUR,KLOR-CON  Take 10 mEq by mouth 2 (two) times daily.     rivaroxaban 20 MG Tabs tablet  Commonly known as:  XARELTO  Take 1 tablet (20 mg total) by mouth daily with supper.        Discharge Assessment: Filed Vitals:   10/02/13 1003  BP: 115/62  Pulse:   Temp:   Resp:    Patient has L heel wound, L great toe wound, and R shin wound.  No evidence of skin tears noted. IV catheter discontinued intact. Site without signs and symptoms of complications - no redness or edema noted at insertion site, patient denies c/o pain - only slight tenderness at site.  Dressing with slight pressure applied.  D/c Instructions-Education: D/C packet given to transport report called to  SNF  Treyveon Mochizuki Consuella Lose, RN 10/02/2013 2:10 PM

## 2013-10-02 NOTE — Progress Notes (Signed)
PATIENT DETAILS Name: Katie Mullins Age: 68 y.o. Sex: female Date of Birth: 08-19-1945 Admit Date: 09/29/2013 Admitting Physician Lorretta Harp, MD PCP:No PCP Per Patient  Subjective: No major issues overnight-no complaints this am  Assessment/Plan: Principal Problem:   Left foot infection - Cellulitis surrounding left heel ulcer, no discharge evident. - Admitted, and started on IV vancomycin/Flagyl-day 3-remains afebriles, clinically improved, will taper to oral Doxycyline on discharge. MRI foot negative for osteo/abscess.Stable for discharge to SNF today - Appreciate wound care consultation-recommendations OZD:[id number] for the left heel to continue to support moist wound healing. Prevalon boots for offloading of the left heel. No topical care recommended for the right great toe or right pretibial area.   Active Problems: Uncontrolled diabetes -Continue with Lantus to 26 units, increase scheduled pre-meal NovoLog 10 units 3 times a day, continue with SSI. Follow CBGs, will adjust accordingly. A1C 9.3  History of bilateral  DVT (deep venous thrombosis) - Continues Xarelto  History of Exertional dyspnea -no evidence of acute decompensated CHF on exam, suspect that this is secondary to deconditioning and weakness. 2-D echocardiogram shows preserved ejection fraction- does show grade 1 diastolic dysfunction  Chronic diastolic dysfunction - Clinically compensated, and Lasix will be resumed on discharge  Diabetic neuropathy - Claims to have tingling/numbness in her b/l Foot-will start Neurontin  Generalized weakness - Patient claims that for the past 1-2 years, she has had progressive weakness primarily in her lower extremities and basically gets around a few feet with the help of a walker, and then for longer distances uses a wheelchair. PT eval appreciated-SNF on discharge.   Reported history of COPD - Lungs clear, as needed nebs  Hypertension - Controlled-continue with  lisinopril  Non-severe (moderate) malnutrition in the context of chronic illness   Disposition: SNF today  DVT Prophylaxis: Xarelto  Code Status: Full code  Family Communication None at bedside  Procedures:  None  CONSULTS:  None  MEDICATIONS: Scheduled Meds: . Chlorhexidine Gluconate Cloth  6 each Topical Q0600  . feeding supplement (GLUCERNA SHAKE)  237 mL Oral TID BM  . gabapentin  300 mg Oral BID  . insulin aspart  0-15 Units Subcutaneous TID WC  . insulin aspart  10 Units Subcutaneous TID WC  . insulin glargine  26 Units Subcutaneous QHS  . lisinopril  10 mg Oral Daily  . metroNIDAZOLE  500 mg Oral 3 times per day  . mupirocin ointment  1 application Nasal BID  . pantoprazole  40 mg Oral Daily  . rivaroxaban  20 mg Oral Q supper  . sodium chloride  3 mL Intravenous Q12H  . vancomycin  1,000 mg Intravenous Q12H   Continuous Infusions:  PRN Meds:.sodium chloride, cyclobenzaprine, oxyCODONE-acetaminophen, sodium chloride  Antibiotics: Anti-infectives   Start     Dose/Rate Route Frequency Ordered Stop   09/30/13 1000  vancomycin (VANCOCIN) IVPB 1000 mg/200 mL premix     1,000 mg 200 mL/hr over 60 Minutes Intravenous Every 12 hours 09/30/13 0353     09/30/13 0054  metroNIDAZOLE (FLAGYL) tablet 500 mg     500 mg Oral 3 times per day 09/30/13 0054     09/29/13 2030  vancomycin (VANCOCIN) IVPB 1000 mg/200 mL premix     1,000 mg 200 mL/hr over 60 Minutes Intravenous  Once 09/29/13 2029 09/29/13 2221       PHYSICAL EXAM: Vital signs in last 24 hours: Filed Vitals:   10/01/13 2115 10/02/13 0529 10/02/13 0701 10/02/13 1003  BP: 121/58 126/62 124/52 115/62  Pulse: 82 87 81   Temp: 98.7 F (37.1 C) 98.6 F (37 C) 98.4 F (36.9 C)   TempSrc: Oral Oral Oral   Resp: Height:      Weight:  85.3 kg (188 lb 0.8 oz)    SpO2: 94% 95% 98%     Weight change: 0.3 kg (10.6 oz) Filed Weights   09/30/13 0620 10/01/13 0500 10/02/13 0529  Weight: 85.4 kg  (188 lb 4.4 oz) 85 kg (187 lb 6.3 oz) 85.3 kg (188 lb 0.8 oz)   Body mass index is 29.45 kg/(m^2).   Gen Exam: Awake and alert with clear speech.  Neck: Supple, No JVD.   Chest: B/L Clear.   CVS: S1 S2 Regular, no murmurs.  Abdomen: soft, BS +, non tender, non distended.  Extremities: no edema, lower extremities warm to touch. Neurologic: B/L lower ext 4/5-chronic per patient Skin: No Rash. Wounds: N/A.   Intake/Output from previous day:  Intake/Output Summary (Last 24 hours) at 10/02/13 1025 Last data filed at 10/01/13 2145  Gross per 24 hour  Intake    573 ml  Output      0 ml  Net    573 ml     LAB RESULTS: CBC  Recent Labs Lab 09/29/13 2029 09/30/13 0321  WBC 5.8 5.0  HGB 12.5 10.4*  HCT 34.9* 30.0*  PLT 156 126*  MCV 78.4 80.6  MCH 28.1 28.0  MCHC 35.8 34.7  RDW 13.0 13.0  LYMPHSABS  --  1.6  MONOABS  --  0.4  EOSABS  --  0.1  BASOSABS  --  0.0    Chemistries   Recent Labs Lab 09/29/13 2029 09/30/13 0321  NA 136* 136*  K 4.4 3.7  CL 99 99  CO2 20 24  GLUCOSE 365* 411*  BUN 22 23  CREATININE 1.02 1.19*  CALCIUM 9.5 8.7    CBG:  Recent Labs Lab 10/01/13 0802 10/01/13 1150 10/01/13 1658 10/01/13 2208 10/02/13 0809  GLUCAP 287* 298* 287* 148* 194*    GFR Estimated Creatinine Clearance: 51.5 ml/min (by C-G formula based on Cr of 1.19).  Coagulation profile  Recent Labs Lab 09/30/13 0128  INR 1.82*    Cardiac Enzymes No results found for this basename: CK, CKMB, TROPONINI, MYOGLOBIN,  in the last 168 hours  No components found with this basename: POCBNP,  No results found for this basename: DDIMER,  in the last 72 hours  Recent Labs  09/30/13 0321  HGBA1C 9.3*   No results found for this basename: CHOL, HDL, LDLCALC, TRIG, CHOLHDL, LDLDIRECT,  in the last 72 hours No results found for this basename: TSH, T4TOTAL, FREET3, T3FREE, THYROIDAB,  in the last 72 hours No results found for this basename: VITAMINB12, FOLATE,  FERRITIN, TIBC, IRON, RETICCTPCT,  in the last 72 hours No results found for this basename: LIPASE, AMYLASE,  in the last 72 hours  Urine Studies No results found for this basename: UACOL, UAPR, USPG, UPH, UTP, UGL, UKET, UBIL, UHGB, UNIT, UROB, ULEU, UEPI, UWBC, URBC, UBAC, CAST, CRYS, UCOM, BILUA,  in the last 72 hours  MICROBIOLOGY: Recent Results (from the past 240 hour(s))  CULTURE, BLOOD (ROUTINE X 2)     Status: None   Collection Time    09/30/13  1:08 AM      Result Value Ref Range Status   Specimen Description BLOOD LEFT ARM   Final   Special Requests  Final   Value: BOTTLES DRAWN AEROBIC AND ANAEROBIC 5CC PATIENT ON FOLLOWING VANCOMYCIN   Culture  Setup Time     Final   Value: 09/30/2013 08:45     Performed at [facility]   Culture     Final   Value:        BLOOD CULTURE RECEIVED NO GROWTH TO DATE CULTURE WILL BE HELD FOR 5 DAYS BEFORE ISSUING A FINAL NEGATIVE REPORT     Performed at [facility]   Report Status PENDING   Incomplete  CULTURE, BLOOD (ROUTINE X 2)     Status: None   Collection Time    09/30/13  1:28 AM      Result Value Ref Range Status   Specimen Description BLOOD LEFT HAND   Final   Special Requests     Final   Value: BOTTLES DRAWN AEROBIC AND ANAEROBIC 5CC EACH PATIENT ON FOLLOWING VANCOMYCIN   Culture  Setup Time     Final   Value: 09/30/2013 08:45     Performed at [facility]   Culture     Final   Value:        BLOOD CULTURE RECEIVED NO GROWTH TO DATE CULTURE WILL BE HELD FOR 5 DAYS BEFORE ISSUING A FINAL NEGATIVE REPORT     Performed at [facility]   Report Status PENDING   Incomplete  MRSA PCR SCREENING     Status: Abnormal   Collection Time    09/30/13  2:37 AM      Result Value Ref Range Status   MRSA by PCR POSITIVE (*) NEGATIVE Final   Comment:            The GeneXpert MRSA Assay (FDA     approved for NASAL specimens     only), is one component of a     comprehensive MRSA colonization      surveillance program. It is not     intended to diagnose MRSA     infection nor to guide or     monitor treatment for     MRSA infections.     RESULT CALLED TO, READ BACK BY AND VERIFIED WITH:     S.COLUMBRES,RN [id number] 09/30/13 M.CAMPBELL    RADIOLOGY STUDIES/RESULTS: Dg Foot Complete Left  09/29/2013   CLINICAL DATA:  Sore on posterior heel with no injury, infected ulcer, diabetes  EXAM: LEFT FOOT - COMPLETE 3+ VIEW  COMPARISON:  None.  FINDINGS: Diffuse osteopenia. No evidence of periosteal reaction. No evidence of cortical destruction. Extensive vascular calcification. No fracture or dislocation.  IMPRESSION: No acute findings.  No radiographic evidence of osteomyelitis.   Electronically Signed   By: Esperanza Heir M.D.   On: 09/29/2013 23:24    Jeoffrey Massed, MD  [facility] Pager:[phone]  If 7PM-7AM, please contact night-coverage [email or web address] Password [email or web address] 10/02/2013, 10:25 AM   LOS: 3 days   **Disclaimer: This note may have been dictated with voice recognition software. Similar sounding words can inadvertently be transcribed and this note may contain transcription errors which may not have been corrected upon publication of note.**

## 2013-10-06 LAB — CULTURE, BLOOD (ROUTINE X 2)
CULTURE: NO GROWTH
Culture: NO GROWTH

## 2014-01-27 ENCOUNTER — Encounter: Payer: Medicare Other | Admitting: Vascular Surgery

## 2015-12-24 IMAGING — CR DG FOOT COMPLETE 3+V*L*
3 series · 3 of 3 positions shown · non-contrast
Comparison: None.

CLINICAL DATA: Sore on posterior heel with no injury, infected
ulcer, diabetes

EXAM:
LEFT FOOT - COMPLETE 3+ VIEW

[view not recorded (1 of 3)]
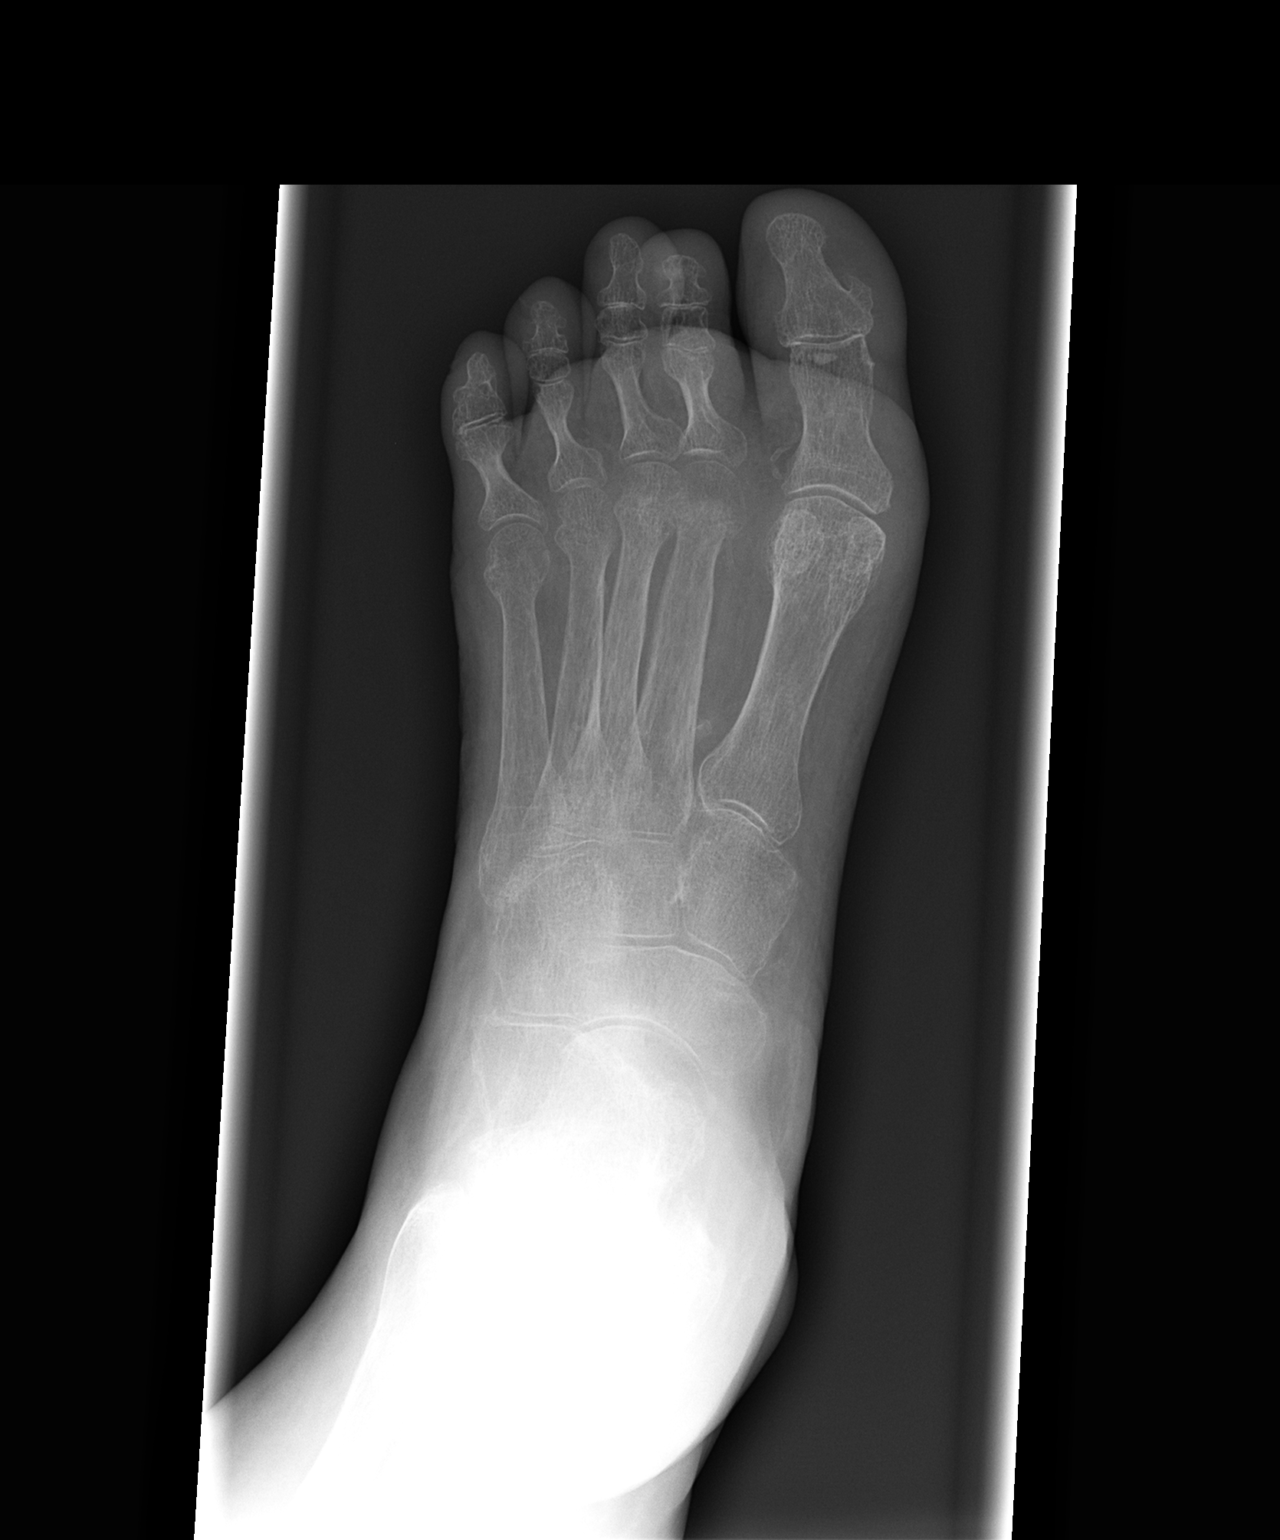

[view not recorded (2 of 3)]
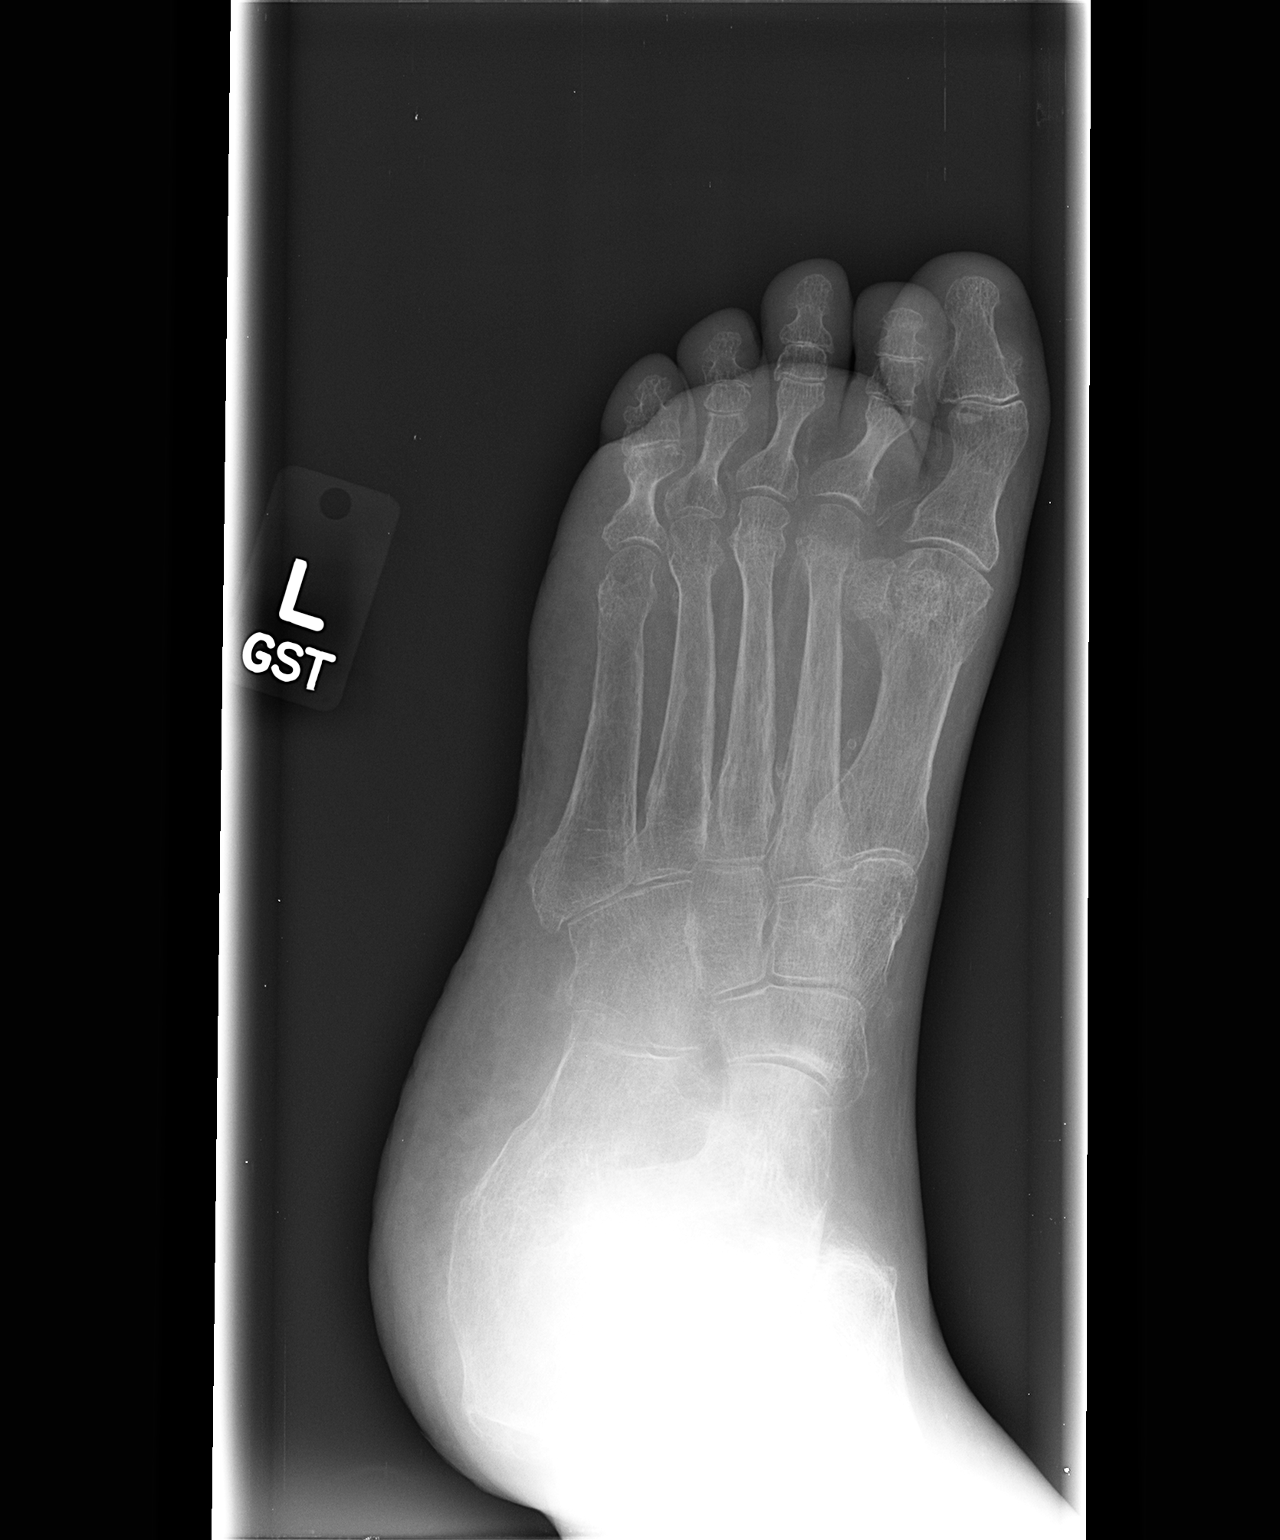

[view not recorded (3 of 3)]
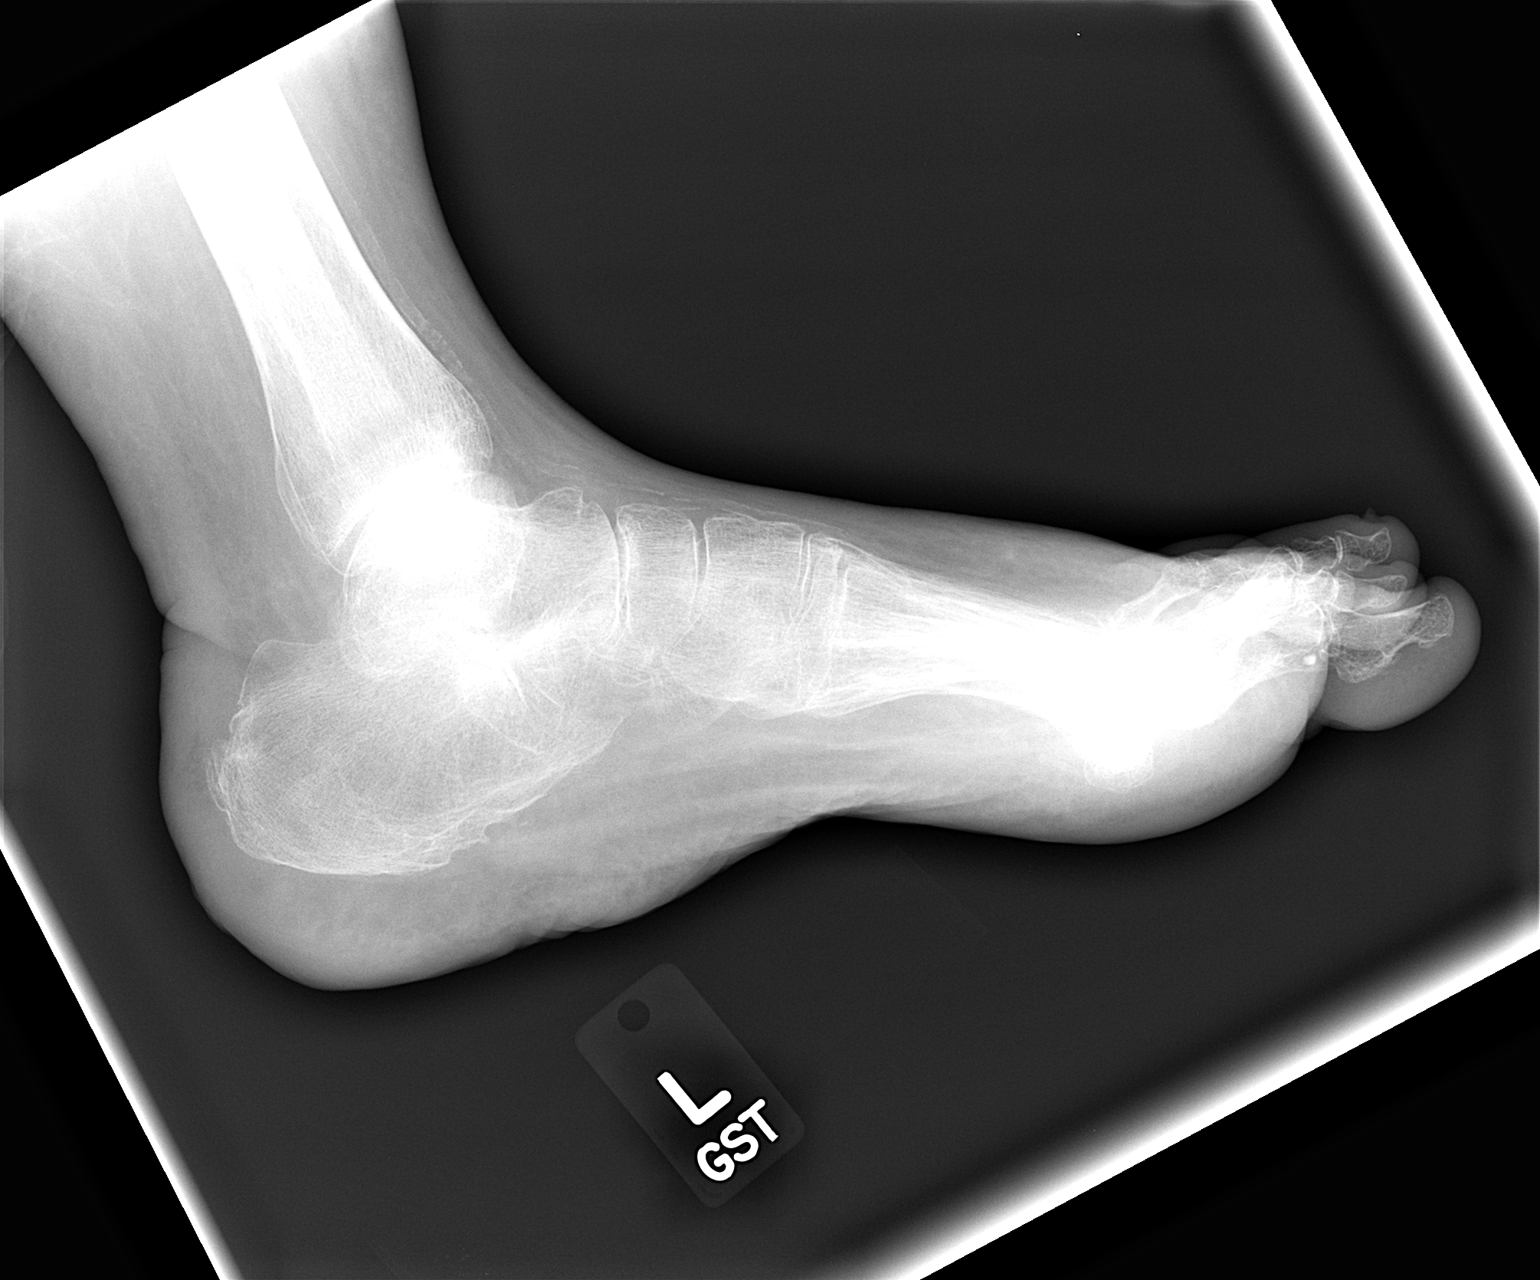

[3 of 3 positions shown; findings below may reference images not displayed]

FINDINGS: Diffuse osteopenia. No evidence of periosteal reaction. No evidence
of cortical destruction. Extensive vascular calcification. No
fracture or dislocation.
IMPRESSION: No acute findings.  No radiographic evidence of osteomyelitis.

## 2016-05-07 DEATH — deceased
